# Patient Record
Sex: Female | Born: 1935 | Race: Black or African American | Hispanic: No | Marital: Single | Smoking: Never smoker
Health system: Southern US, Community
[De-identification: ages and names within clinical notes are randomized; demographics above are authoritative.]

## PROBLEM LIST (undated history)

## (undated) DIAGNOSIS — M1990 Unspecified osteoarthritis, unspecified site: Secondary | ICD-10-CM

## (undated) DIAGNOSIS — Z7722 Contact with and (suspected) exposure to environmental tobacco smoke (acute) (chronic): Secondary | ICD-10-CM

## (undated) HISTORY — DX: Contact with and (suspected) exposure to environmental tobacco smoke (acute) (chronic): Z77.22

## (undated) HISTORY — PX: HIP SURGERY: SHX245

## (undated) HISTORY — PX: TONSILLECTOMY: SUR1361

---

## 2013-05-01 HISTORY — PX: CATARACT EXTRACTION: SUR2

## 2013-05-28 HISTORY — PX: TOTAL HIP ARTHROPLASTY: SHX124

## 2013-11-15 HISTORY — PX: CATARACT EXTRACTION: SUR2

## 2016-04-06 MED ORDER — MEPOLIZUMAB 100 MG SUBCUTANEOUS SOLUTION
100 mg | Freq: Once | SUBCUTANEOUS | Status: AC
Start: 2016-04-06 — End: 2016-04-07
  Administered 2016-04-07: 18:00:00 via SUBCUTANEOUS

## 2016-04-06 MED FILL — NUCALA 100 MG SUBCUTANEOUS SOLUTION: 100 mg | SUBCUTANEOUS | Qty: 100

## 2016-04-07 ENCOUNTER — Inpatient Hospital Stay: Admit: 2016-04-07 | Payer: MEDICARE | Primary: Internal Medicine

## 2016-04-07 DIAGNOSIS — J4550 Severe persistent asthma, uncomplicated: Secondary | ICD-10-CM

## 2016-04-07 NOTE — Progress Notes (Signed)
[facility] Progress Note    Date: April 07, 2016    Name: Rachael Gonzalez    MRN: [id number]         DOB: 1932-10-25      12510 Pt arrived ambulatory and in no distress for Nucala injection.      Assessment complete. No new complaints voiced. 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  Patient unable to provide and good history.  PCP office called and was able to fax over a previous office note which provided history and a medication list.        Visit Vitals   ??? BP 134/72 (BP 1 Location: Right arm, BP Patient Position: Sitting)   ??? Pulse 81   ??? Temp 97.7 ??F (36.5 ??C)   ??? Resp 18   ??? Breastfeeding No       Medications received:  mepolizumab (NUCALA) injection 100 mg   Patient observed for 20 mins post injection.        1410 Discharged home ambulatory and in no distress, accompanied by friend.     Next appointment 05/05/16, patient aware.        Jaynie Collinsuanda L Enzo Treu, RN  April 07, 2016

## 2016-04-30 MED ORDER — MEPOLIZUMAB 100 MG SUBCUTANEOUS SOLUTION
100 mg | Freq: Once | SUBCUTANEOUS | Status: AC
Start: 2016-04-30 — End: 2016-05-05
  Administered 2016-05-05: 19:00:00 via SUBCUTANEOUS

## 2016-04-30 MED FILL — NUCALA 100 MG SUBCUTANEOUS SOLUTION: 100 mg | SUBCUTANEOUS | Qty: 100

## 2016-05-05 ENCOUNTER — Inpatient Hospital Stay: Admit: 2016-05-05 | Payer: MEDICARE | Primary: Internal Medicine

## 2016-05-05 NOTE — Progress Notes (Signed)
[facility] Progress Note    Date: May 05, 2016    Name: Rachael Gonzalez    MRN: [id number]         DOB: 1932/10/16      1325 Pt arrived ambulatory and in no distress for Nucala injection.      Assessment complete. No new complaints voiced.     Visit Vitals   ??? BP 148/56 (BP 1 Location: Right arm, BP Patient Position: Sitting)   ??? Pulse 75   ??? Temp 97.9 ??F (36.6 ??C)   ??? Resp 18   ??? SpO2 99%       Medications received:  mepolizumab (NUCALA) injection 100 mg, given in the right arm      1355 Discharged home ambulatory and in no distress, accompanied by self.     Next appointment 06/02/16, patient aware.        Jaynie Collinsuanda L Loistine Eberlin, RN  May 05, 2016

## 2016-05-05 NOTE — Progress Notes (Signed)
Problem: Knowledge Deficit  Goal: *Verbalizes understanding of procedures and medications  Outcome: Progressing Towards Goal  Patient denies any questio or concerns regarding medication

## 2016-06-01 MED ORDER — MEPOLIZUMAB 100 MG SUBCUTANEOUS SOLUTION
100 mg | Freq: Once | SUBCUTANEOUS | Status: AC
Start: 2016-06-01 — End: 2016-06-02
  Administered 2016-06-02: 19:00:00 via SUBCUTANEOUS

## 2016-06-01 MED FILL — NUCALA 100 MG SUBCUTANEOUS SOLUTION: 100 mg | SUBCUTANEOUS | Qty: 100

## 2016-06-02 ENCOUNTER — Inpatient Hospital Stay: Admit: 2016-06-02 | Payer: MEDICARE | Primary: Internal Medicine

## 2016-06-02 DIAGNOSIS — J4550 Severe persistent asthma, uncomplicated: Secondary | ICD-10-CM

## 2016-06-02 NOTE — Progress Notes (Signed)
[facility] Progress Note    Date: June 02, 2016    Name: Rachael Gonzalez    MRN: [id number]         DOB: 1932/06/18      1325 Pt arrived ambulatory and in no distress for Nucala injection.      Assessment complete. No new complaints voiced.     Visit Vitals   ??? BP 144/76   ??? Pulse 90   ??? Temp 98.3 ??F (36.8 ??C)   ??? Resp 18       Medications received:  mepolizumab (NUCALA) injection 100 mg, given in the left arm      1340 Discharged home ambulatory and in no distress, accompanied by self.     Next appointment 06/30/16, patient aware.        Jaynie Collinsuanda L Rodrecus Belsky, RN  June 02, 2016

## 2016-06-28 MED ORDER — MEPOLIZUMAB 100 MG SUBCUTANEOUS SOLUTION
100 mg | Freq: Once | SUBCUTANEOUS | Status: AC
Start: 2016-06-28 — End: 2016-06-30
  Administered 2016-06-30: 18:00:00 via SUBCUTANEOUS

## 2016-06-28 MED FILL — NUCALA 100 MG SUBCUTANEOUS SOLUTION: 100 mg | SUBCUTANEOUS | Qty: 100

## 2016-06-30 ENCOUNTER — Inpatient Hospital Stay: Admit: 2016-06-30 | Payer: MEDICARE | Primary: Internal Medicine

## 2016-06-30 DIAGNOSIS — J4550 Severe persistent asthma, uncomplicated: Secondary | ICD-10-CM

## 2016-06-30 NOTE — Progress Notes (Signed)
1320 Pt arrived ambulatory and in no distress for Nucala.  Assessment complete. Pt reports "this shot isn't working, I'm still coughing all the time." Pt reports she will discuss with physician on follow-up visit.   Patient Vitals for the past 12 hrs:   Temp Pulse Resp BP SpO2   06/30/16 1325 97.8 ??F (36.6 ??C) (!) 106 18 130/71 98 %     Medications:  Nucala 100 mg sq right arm    1355 Discharged home ambulatory and in no distress, accompanied by friend. Pt to follow-up with referring, reports she may not continue with injection.

## 2016-07-28 MED ORDER — MEPOLIZUMAB 100 MG SUBCUTANEOUS SOLUTION
100 mg | Freq: Once | SUBCUTANEOUS | Status: AC
Start: 2016-07-28 — End: 2016-07-29
  Administered 2016-07-29: 17:00:00 via SUBCUTANEOUS

## 2016-07-28 MED FILL — NUCALA 100 MG SUBCUTANEOUS SOLUTION: 100 mg | SUBCUTANEOUS | Qty: 100

## 2016-07-29 ENCOUNTER — Inpatient Hospital Stay: Admit: 2016-07-29 | Payer: MEDICARE | Primary: Internal Medicine

## 2016-07-29 DIAGNOSIS — J45909 Unspecified asthma, uncomplicated: Secondary | ICD-10-CM

## 2016-07-29 NOTE — Progress Notes (Signed)
[facility] Progress Note    Date: July 29, 2016    Name: Rachael Gonzalez    MRN: [id number]         DOB: 1932/11/04      1215 Pt arrived ambulatory and in no distress for Nucala injection.  Pt reports she "still thinks this isn't working, but I'll take two more like the doctor asked." Pt denies any worsening cough or secretions, denies fever; reports cough "just no better."    Assessment complete. No new complaints voiced.   Medications received:  mepolizumab (NUCALA) injection 100 mg, given in the rightarm      1255 Discharged home ambulatory and in no distress, accompanied by self.     Next appointment 08/25/16, patient aware.        Bryna Colander, RN  July 29, 2016

## 2016-08-24 MED ORDER — MEPOLIZUMAB 100 MG SUBCUTANEOUS SOLUTION
100 mg | Freq: Once | SUBCUTANEOUS | Status: AC
Start: 2016-08-24 — End: 2016-08-25
  Administered 2016-08-25: 17:00:00 via SUBCUTANEOUS

## 2016-08-24 MED FILL — NUCALA 100 MG SUBCUTANEOUS SOLUTION: 100 mg | SUBCUTANEOUS | Qty: 100

## 2016-08-25 ENCOUNTER — Inpatient Hospital Stay: Admit: 2016-08-25 | Payer: MEDICARE | Primary: Internal Medicine

## 2016-08-25 DIAGNOSIS — J45909 Unspecified asthma, uncomplicated: Secondary | ICD-10-CM

## 2016-08-25 NOTE — Progress Notes (Signed)
[facility] Progress Note    Date: Aug 25, 2016    Name: Rachael Gonzalez    MRN: [id number]         DOB: [birth year]      1230 Pt arrived ambulatory and in no distress for Nucala injection.  Pt reports " this isn't working." Reports she is willing to take this last injection and see provider on 09/17/16 regarding an alternative treatment. Pt denies any worsening cough or secretions, denies fever; reports cough "just no better."  Patient Vitals for the past 12 hrs:   Temp Pulse Resp BP SpO2   08/25/16 1233 98.5 ??F (36.9 ??C) 76 18 126/63 95 %       Assessment complete. No new complaints voiced.   Medications received:  mepolizumab (NUCALA) injection 100 mg sq right arm      1300 Discharged home ambulatory and in no distress, accompanied by self.     Next appointment 09/29/16, patient aware.  Pt reports she will contact [facility] after appt with provider regarding future injections.       Bryna ColanderPriscilla N Ambang, RN  Aug 25, 2016

## 2016-09-24 MED ORDER — MEPOLIZUMAB 100 MG SUBCUTANEOUS SOLUTION
100 mg | Freq: Once | SUBCUTANEOUS | Status: AC
Start: 2016-09-24 — End: 2016-09-29

## 2016-09-24 MED FILL — NUCALA 100 MG SUBCUTANEOUS SOLUTION: 100 mg | SUBCUTANEOUS | Qty: 100

## 2016-09-30 ENCOUNTER — Inpatient Hospital Stay: Payer: MEDICARE | Primary: Internal Medicine

## 2019-11-27 ENCOUNTER — Other Ambulatory Visit: Payer: Self-pay | Admitting: Family Medicine

## 2019-11-27 DIAGNOSIS — J411 Mucopurulent chronic bronchitis: Secondary | ICD-10-CM

## 2019-12-04 ENCOUNTER — Ambulatory Visit
Admission: RE | Admit: 2019-12-04 | Discharge: 2019-12-04 | Disposition: A | Discharge status: Home / Self Care | Payer: Medicare Other | Source: Ambulatory Visit | Attending: Family Medicine | Admitting: Family Medicine

## 2019-12-04 DIAGNOSIS — J411 Mucopurulent chronic bronchitis: Secondary | ICD-10-CM

## 2020-01-13 NOTE — Progress Notes (Signed)
Synopsis: Referred in Oct 2021 for cough by Margot Ables*  Subjective:   [name] GENDER: female DOB: 1932/11/27, MRN: [id number]  Chief Complaint  Patient presents with  . Consult    cough like "whooping cough" since August. abnormal Xray referred to office.    86 yo FM, PMH no signifigant history except hip surgery and second hand smoke exposure. C/o cough. She has had cough for about 10 years. Her cough occurs mainly at night time. She has episodic flares, thick white mucus. When she drinks water and tea and when she drinks stuff and she coughs. She occasionally gets choked on foods. 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  History is somewhat limited due to the patient's difficulty hearing.  However with talking slow and loudly she is able to understand well.  Her biggest issue is the ongoing trouble with coughing.  She has not had any blood.  Denies hemoptysis.  No recent sick contacts.  Weight has been stable.   Past Medical History:  Diagnosis Date  . Second hand smoke exposure      Family History  Problem Relation Age of Onset  . Breast cancer Mother   . Stroke Father      Past Surgical History:  Procedure Laterality Date  . HIP SURGERY      Social History   Socioeconomic History  . Marital status: Single    Spouse name: Not on file  . Number of children: Not on file  . Years of education: Not on file  . Highest education level: Not on file  Occupational History  . Not on file  Tobacco Use  . Smoking status: Passive Smoke Exposure - Never Smoker  . Smokeless tobacco: Never Used  Substance and Sexual Activity  . Alcohol use: Not on file  . Drug use: Not on file  . Sexual activity: Not on file  Other Topics Concern  . Not on file  Social History Narrative  . Not on file   Social Determinants of Health   Financial Resource Strain:   . Difficulty of Paying Living Expenses: Not on file   Food Insecurity:   . Worried About Programme researcher, broadcasting/film/video in the Last Year: Not on file  . Ran Out of Food in the Last Year: Not on file  Transportation Needs:   . Lack of Transportation (Medical): Not on file  . Lack of Transportation (Non-Medical): Not on file  Physical Activity:   . Days of Exercise per Week: Not on file  . Minutes of Exercise per Session: Not on file  Stress:   . Feeling of Stress : Not on file  Social Connections:   . Frequency of Communication with Friends and Family: Not on file  . Frequency of Social Gatherings with Friends and Family: Not on file  . Attends Religious Services: Not on file  . Active Member of Clubs or Organizations: Not on file  . Attends Banker Meetings: Not on file  . Marital Status: Not on file  Intimate Partner Violence:   . Fear of Current or Ex-Partner: Not on file  . Emotionally Abused: Not on file  . Physically Abused: Not on file  . Sexually Abused: Not on file     Not on File   No outpatient medications prior to visit.   No facility-administered medications prior to visit.    Review of Systems  Constitutional: Negative for chills, fever, malaise/fatigue  and weight loss.  HENT: Negative for hearing loss, sore throat and tinnitus.   Eyes: Negative for blurred vision and double vision.  Respiratory: Positive for cough, sputum production and shortness of breath. Negative for hemoptysis, wheezing and stridor.   Cardiovascular: Negative for chest pain, palpitations, orthopnea, leg swelling and PND.  Gastrointestinal: Negative for abdominal pain, constipation, diarrhea, heartburn, nausea and vomiting.  Genitourinary: Negative for dysuria, hematuria and urgency.  Musculoskeletal: Negative for joint pain and myalgias.  Skin: Negative for itching and rash.  Neurological: Negative for dizziness, tingling, weakness and headaches.  Endo/Heme/Allergies: Negative for environmental allergies. Does not bruise/bleed easily.   Psychiatric/Behavioral: Negative for depression. The patient is not nervous/anxious and does not have insomnia.   All other systems reviewed and are negative.    Objective:  Physical Exam Vitals reviewed.  Constitutional:      General: She is not in acute distress.    Appearance: She is well-developed.  HENT:     Head: Normocephalic and atraumatic.  Eyes:     General: No scleral icterus.    Conjunctiva/sclera: Conjunctivae normal.     Pupils: Pupils are equal, round, and reactive to light.  Neck:     Vascular: No JVD.     Trachea: No tracheal deviation.  Cardiovascular:     Rate and Rhythm: Normal rate and regular rhythm.     Heart sounds: Normal heart sounds. No murmur heard.   Pulmonary:     Effort: Pulmonary effort is normal. No tachypnea, accessory muscle usage or respiratory distress.     Breath sounds: Normal breath sounds. No stridor. No wheezing, rhonchi or rales.  Abdominal:     General: Bowel sounds are normal. There is no distension.     Palpations: Abdomen is soft.     Tenderness: There is no abdominal tenderness.  Musculoskeletal:        General: No tenderness.     Cervical back: Neck supple.  Lymphadenopathy:     Cervical: No cervical adenopathy.  Skin:    General: Skin is warm and dry.     Capillary Refill: Capillary refill takes less than 2 seconds.     Findings: No rash.  Neurological:     Mental Status: She is alert and oriented to person, place, and time.  Psychiatric:        Behavior: Behavior normal.      Vitals:   01/14/20 0927  BP: 118/60  Pulse: 78  Temp: (!) 97 F (36.1 C)  TempSrc: Temporal  SpO2: 97%  Weight: 166 lb (75.3 kg)  Height: 5' 2.75" (1.594 m)   97% on RA BMI Readings from Last 3 Encounters:  01/14/20 29.64 kg/m   Wt Readings from Last 3 Encounters:  01/14/20 166 lb (75.3 kg)     CBC No results found for: WBC, RBC, HGB, HCT, PLT, MCV, MCH, MCHC, RDW, LYMPHSABS, MONOABS, EOSABS, BASOSABS    Chest  Imaging: August 2021 CT chest: Bilateral lower lobe tubular bronchiectasis with scattered tree-in-bud. The patient's images have been independently reviewed by me.    Pulmonary Functions Testing Results: No flowsheet data found.  FeNO:   Pathology:   Echocardiogram:   Heart Catheterization:     Assessment & Plan:     ICD-10-CM   1. Bronchiectasis without complication (HCC)  J47.9   2. Increased sputum production  R09.3   3. Oropharyngeal dysphagia  R13.12 SLP modified barium swallow    DG Swallowing Func-Speech Pathology  4. Obstructive lung disease (generalized) (HCC)  J44.9   5. Chronic bronchitis, unspecified chronic bronchitis type (HCC)  J42    Discussion:  This is an 86 year old female, difficulty hearing, with evaluation today of cough for greater than 10 years.  She had a pulmonologist in [place] before moving here.  She has been seen by an allergist as well.  She currently takes no medications.  Her primary care doctor did put her on albuterol inhaler plus azithromycin recently.  In her history she does have difficulty swallowing and coughing with thin liquids.  Her CT scan of the chest has basilar predominant tubular bronchiectasis which would be consistent with chronic longstanding history of aspiration into the airway.  This recurrent airway injury has led to chronic bronchitis, bronchial thickening and increased sputum production.  Changes on the CT scan could represent MAI.  However would potentially expect to see more parenchymal involvement.  Plan: We will give her a albuterol nebulizer to help keep her airways open. We discussed the importance of airway clearance techniques.  Patient was counseled on these today in the office. New prescription for flutter valve. Also placed orders for M BSS and swallow function study by speech-language pathology. Orders also placed for sputum cultures.  To include AFB fungus and respiratory Gram stain. Patient to follow-up in our  clinic in approximately 6 weeks after swallow study has been completed.  No current outpatient medications on file.  I spent 62 minutes dedicated to the care of this patient on the date of this encounter to include pre-visit review of records, face-to-face time with the patient discussing conditions above, post visit ordering of testing, clinical documentation with the electronic health record, making appropriate referrals as documented, and communicating necessary findings to members of the patients care team.   Josephine Igo, DO  Pulmonary Critical Care 01/14/2020 9:51 AM

## 2020-01-13 NOTE — Patient Instructions (Addendum)
Thank you for visiting Dr. Tonia Brooms at [facility]. Today we recommend the following:  DME Supply order for albuterol solution, nebulizer machine and tubing/supplies  Flutter valve  Swallow study has been ordered.  Suptum respiratory cultures today  Return in about 6 weeks (around 02/25/2020) for with APP or Dr. Tonia Brooms.    Please do your part to reduce the spread of COVID-19.

## 2020-01-14 ENCOUNTER — Ambulatory Visit (INDEPENDENT_AMBULATORY_CARE_PROVIDER_SITE_OTHER): Payer: Medicare Other | Admitting: Pulmonary Disease

## 2020-01-14 ENCOUNTER — Encounter: Payer: Self-pay | Admitting: Pulmonary Disease

## 2020-01-14 ENCOUNTER — Other Ambulatory Visit: Payer: Self-pay

## 2020-01-14 VITALS — BP 118/60 | HR 78 | Temp 97.0°F | Ht 62.75 in | Wt 166.0 lb

## 2020-01-14 DIAGNOSIS — R1312 Dysphagia, oropharyngeal phase: Secondary | ICD-10-CM | POA: Diagnosis not present

## 2020-01-14 DIAGNOSIS — J479 Bronchiectasis, uncomplicated: Secondary | ICD-10-CM

## 2020-01-14 DIAGNOSIS — R093 Abnormal sputum: Secondary | ICD-10-CM

## 2020-01-14 DIAGNOSIS — J42 Unspecified chronic bronchitis: Secondary | ICD-10-CM

## 2020-01-14 DIAGNOSIS — J449 Chronic obstructive pulmonary disease, unspecified: Secondary | ICD-10-CM | POA: Diagnosis not present

## 2020-01-14 DIAGNOSIS — Z23 Encounter for immunization: Secondary | ICD-10-CM

## 2020-01-17 ENCOUNTER — Other Ambulatory Visit: Payer: Medicare Other

## 2020-01-17 ENCOUNTER — Telehealth: Payer: Self-pay | Admitting: Pulmonary Disease

## 2020-01-17 DIAGNOSIS — J449 Chronic obstructive pulmonary disease, unspecified: Secondary | ICD-10-CM

## 2020-01-17 DIAGNOSIS — R093 Abnormal sputum: Secondary | ICD-10-CM

## 2020-01-17 DIAGNOSIS — J479 Bronchiectasis, uncomplicated: Secondary | ICD-10-CM

## 2020-01-17 MED ORDER — ALBUTEROL SULFATE (2.5 MG/3ML) 0.083% IN NEBU: 2.5000 mg | INHALATION_SOLUTION | Freq: Four times a day (QID) | RESPIRATORY_TRACT | 5 refills | Status: DC | PRN

## 2020-01-17 NOTE — Telephone Encounter (Signed)
Spoke with the pt  She states needing albuterol neb sol to go to [employer] only sent neb machine- no med OR supplies  Now she only needs the supplies since med sent to [facility]  Will forward to [facility] to reach out to [employer] about her CPAP supplies

## 2020-01-21 ENCOUNTER — Other Ambulatory Visit (HOSPITAL_COMMUNITY): Payer: Self-pay

## 2020-01-21 DIAGNOSIS — R1310 Dysphagia, unspecified: Secondary | ICD-10-CM

## 2020-01-21 NOTE — Telephone Encounter (Signed)
High priority message sent to [employer] for an update.

## 2020-01-21 NOTE — Telephone Encounter (Signed)
Spoke with the pt  She received the neb supplies but has still not picked up neb solution  I explained to her on 01/17/20 that this was being sent to [facility]  She did not recall this and is aware med has been sent  Nothing further needed

## 2020-01-21 NOTE — Telephone Encounter (Signed)
Pt is calling back stating that she really needs her nebulizer solution - CB# [phone]

## 2020-01-30 ENCOUNTER — Other Ambulatory Visit: Payer: Self-pay

## 2020-01-30 ENCOUNTER — Ambulatory Visit (HOSPITAL_COMMUNITY)
Admission: RE | Admit: 2020-01-30 | Discharge: 2020-01-30 | Disposition: A | Discharge status: Home / Self Care | Payer: Medicare Other | Source: Ambulatory Visit | Attending: Pulmonary Disease | Admitting: Pulmonary Disease

## 2020-01-30 DIAGNOSIS — R1312 Dysphagia, oropharyngeal phase: Secondary | ICD-10-CM | POA: Insufficient documentation

## 2020-01-30 DIAGNOSIS — R1310 Dysphagia, unspecified: Secondary | ICD-10-CM | POA: Insufficient documentation

## 2020-01-30 NOTE — Progress Notes (Signed)
Speech Pathology - OP MBS  Clinical impressions:  Pt presents with a likely primary esophageal dysphagia with a normal oropharyngeal swallow with adequate mastication, brisk swallow response, and reliable laryngeal vestibule closure with no penetration/aspiration.  Brief scans of the esophagus revealed solid food/liquid barium retention and potential backflow.  Immediately after study, pt began unremitting coughing, regurgitation of barium/secretions, then began crying.  Support offered. Study was reviewed in basic terms, with recommendations to f/u with GI regarding esophageal function and likely dysmotility.  Handout with recs for dealing with esophageal deficits was provided and reviewed.  Pt verbalized understanding but she needs reinforcement.  She appeared anxious and was offered encouragement.  Pt would benefit from a GI consult, and hopefully ASAP given how uncomfortable she is.   Burech Mcfarland L. Samson Frederic, MA CCC/SLP Acute Rehabilitation Services Office number [phone] Pager [phone]

## 2020-01-31 ENCOUNTER — Other Ambulatory Visit: Payer: Self-pay

## 2020-01-31 DIAGNOSIS — R1319 Other dysphagia: Secondary | ICD-10-CM

## 2020-02-11 ENCOUNTER — Telehealth: Payer: Self-pay | Admitting: Pulmonary Disease

## 2020-02-11 MED ORDER — MUCINEX MAXIMUM STRENGTH 1200 MG PO TB12: 1.0000 | ORAL_TABLET | Freq: Two times a day (BID) | ORAL | 5 refills | Status: DC

## 2020-02-11 NOTE — Telephone Encounter (Signed)
Called and spoke with pt letting her know the info and recommendations stated by [facility] and she verbalized understanding. Pt said after the swallow study they did give her some paperwork about different risk precautions so she does not need to have anything mailed to her. Pt said that she would like to have  Rx for mucinex 1200mg  sent to pharmacy for her. This has been sent to preferred pharmacy for pt. Nothing further needed.

## 2020-02-11 NOTE — Telephone Encounter (Signed)
Plan: We will give her a albuterol nebulizer to help keep her airways open. We discussed the importance of airway clearance techniques.  Patient was counseled on these today in the office. New prescription for flutter valve. Also placed orders for M BSS and swallow function study by speech-language pathology. Orders also placed for sputum cultures.  To include AFB fungus and respiratory Gram stain. Patient to follow-up in our clinic in approximately 6 weeks after swallow study has been completed.     Called and spoke with pt who states she has been using the flutter valve and states she begins to cough so bad that she gets to where she is unable to eat due to coughing so much. Pt also stated after drinking some water or any other liquids, she will begin to cough a lot again. Pt said she has been coughing so much to the point that she almost throws up.  Pt also said that her coughing is interfering with her being able to sleep at night. Pt said when she drinks liquids, she will drink it at room temp not cold temp. Pt said she has been using the albuterol neb sol that was prescribed but says during a treatment, she will have to stop using it and then will have to spit out a lot of phlegm. Pt said that the phlegm she gets up is thick in consistency.  Asked pt if she has tried any OTC cough meds and she said that she did try Vicks cough med but that did not help. Pt said that she also has tried Robitussin but that did not help.  Asked pt if she was running a temp and she said that she did not have a thermometer to be able to check her temp but said that her forehead did feel warm but she did also just have a coughing fit.   Pt said that she just had swallow study performed and she said on the paperwork that was given to her after the study it said that it was recommended for her to follow up with GI. Pt wants to know what could be recommended to help with her symptoms.   Beth, please advise.

## 2020-02-11 NOTE — Telephone Encounter (Signed)
She should be using her nebulizer 2-3 times a day. That is a good thing she is getting mucus up, it will be thick in consistency that is to be expected with her condition. Recommend mucinex tablets 1,200mg  twice daily, we can send in prescription if needed  Barium swallow showed evidence of esophageal dysphagia. No aspiration. Recommending regular solids, thing liquids. Remain upright after eating. Regular oral care twice daily. We can mail her aspiration risk precautions

## 2020-02-25 ENCOUNTER — Other Ambulatory Visit: Payer: Self-pay

## 2020-02-25 ENCOUNTER — Ambulatory Visit (INDEPENDENT_AMBULATORY_CARE_PROVIDER_SITE_OTHER): Payer: Medicare Other | Admitting: Acute Care

## 2020-02-25 ENCOUNTER — Encounter: Payer: Self-pay | Admitting: Acute Care

## 2020-02-25 VITALS — BP 118/72 | HR 86 | Temp 97.7°F | Ht 62.0 in | Wt 160.6 lb

## 2020-02-25 DIAGNOSIS — R059 Cough, unspecified: Secondary | ICD-10-CM

## 2020-02-25 DIAGNOSIS — J479 Bronchiectasis, uncomplicated: Secondary | ICD-10-CM | POA: Diagnosis not present

## 2020-02-25 DIAGNOSIS — R1319 Other dysphagia: Secondary | ICD-10-CM | POA: Diagnosis not present

## 2020-02-25 NOTE — Patient Instructions (Addendum)
It is good to see you today. Sips of water instead of throat clearing Sugar Free Coca-Cola or Werther's originals for throat soothing. Delsym Cough syrup 5 cc's every 12 hours Non-sedating antihistamine of your choice daily ( Zyrtec, Allegra, Xyzol, Claritin ( Generic ok) We will refer you for GI consult for esophageal dysmotility.>> called and they will schedule patient today >> PPI, and GERD diet Consider sleeoing with your head of bed elevated .  Continue using your flutter valve 1-2 times daily, 3-4 puffs each time.  Continue Mucinex as you have been doing . We will have follow up after you see the GI doctor.  Follow the guidelines the speech therapist gave you regarding foods to avoid. Follow up in 1 month with Dr. Tonia Brooms or Maralyn Sago NP after GI evaluation Please contact office for sooner follow up if symptoms do not improve or worsen or seek emergency care

## 2020-02-25 NOTE — Progress Notes (Signed)
History of Present Illness Melinda Valencia is a 86 y.o. female with bronchiectasis, and HOH. She is followed by Dr. Tonia Brooms.   Synopsis 86 yo FM, PMH no signifigant history except hip surgery and second hand smoke exposure. C/o cough. She has had cough for about 10 years. Her cough occurs mainly at night time. She has episodic flares, thick white mucus. When she drinks water and tea and when she drinks liquids  and she coughs. She occasionally gets choked on foods. Swallow study was done and was negative for aspiration, but + for esophageal dysphagia. Marland Kitchen She was referred to GI for further work up.  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    02/25/2020  Pt. Presents for follow up stating she is coughing worse than her baseline. She states she has tried using the over the counter Mucinex, and she states her coughing is worse.  She does not seem to understand that she needs to cough up secretions up with her bronchiectasis to prevent infection. She just wants to stop the coughing. She is very frustrated, made more so by her hardness of hearing. We reviewed her swallow evaluation, and I explained that her Barium swallow was + for esophageal dysphagia/ dysmotility, and that this may be contributing to her cough. Dr. Tonia Brooms was concerned this was consistent with chronic longstanding history of aspiration into the airway.  This recurrent airway injury has led to chronic bronchitis, bronchial thickening and increased sputum production. She has been referred for GI consult, but has not been scheduled for appointment. I explained that her esophageal dysphagia may be contributing to her cough, and needs further work up. We discussed the foods she should be avoiding and reviewed the recommendations of her speech therapist. .  She states she is using her nebs daily, and that they also make her cough. Secretions are clear to white. She has no fever. She has had her Covid vaccine, and  her flu vaccine. When I asked about her Flutter valve, she stated that she had attached it to her neb machine. I pulled up a YouTube instruction video, and we watched it together. She said she would take the flutter valve off the neb machine and use it as we discussed after watching the video together. She prefers instruction written down as she has a very hard time hearing.   Test Results: Pt presents with a likely primary esophageal dysphagia with a normal oropharyngeal swallow with adequate mastication, brisk swallow response, and reliable laryngeal vestibule closure with no penetration/aspiration.  Brief scans of the esophagus revealed solid food/liquid barium retention and potential backflow.  Immediately after study, pt began unremitting coughing, regurgitation of barium/secretions, then began crying.  Support offered. Study was reviewed in basic terms, with recommendations to f/u with GI regarding esophageal function and likely dysmotility.  Handout with recs for dealing with esophageal deficits was provided and reviewed.  Pt verbalized understanding but she needs reinforcement.  She appeared anxious and was offered encouragement.  Pt would benefit from a GI consult, and hopefully ASAP given how uncomfortable she is.    SLP Visit Diagnosis Dysphagia, pharyngoesophageal phase (R13.14)    10/14 Sputum Cultures >> AFB Negative x 1 sample, negative for fungal, Normal Flora  CT Chest 12/04/2019>> Mild, tubular bronchiectasis throughout, most conspicuous in the right lower lobe. Innumerable scattered small centrilobular ground-glass pulmonary nodules throughout the lungs, nonspecific and infectious or inflammatory. No specific features to suggest a particular etiology, such as  atypical mycobacterium. Coronary artery disease.  Aortic Atherosclerosis   No flowsheet data found.  No flowsheet data found.  BNP No results found for: BNP  ProBNP No results found for: PROBNP  PFT No results  found for: FEV1PRE, FEV1POST, FVCPRE, FVCPOST, TLC, DLCOUNC, PREFEV1FVCRT, PSTFEV1FVCRT  DG SWALLOW FUNC OP MEDICARE SPEECH PATH  Result Date: 01/30/2020 Objective Swallowing Evaluation: Type of Study: MBS-Modified Barium Swallow Study  Patient Details Name: Melinda Valencia MRN: [id number] Date of Birth: [birth year] Today's Date: 01/30/2020 Time: SLP Start Time (ACUTE ONLY): 1155 -SLP Stop Time (ACUTE ONLY): 1225 SLP Time Calculation (min) (ACUTE ONLY): 30 min Past Medical History: Past Medical History: Diagnosis Date . Second hand smoke exposure  Past Surgical History: Past Surgical History: Procedure Laterality Date . HIP SURGERY   HPI:  86 year old female referred for OP MBS by Dr. Tonia BroomsIcard, pulmonology, due to chronic cough and difficulty swallowing, coughing with thin liquids.  Per MD, CT scan of the chest has basilar predominant tubular bronchiectasis which would be consistent with chronic longstanding history of aspiration into the airway.  Pt arrived with container to catch her secretion. She reported constant coughing, particularly at night, with production of secretions.   Subjective: communicative Assessment / Plan / Recommendation CHL IP CLINICAL IMPRESSIONS 01/30/2020 Clinical Impression Pt presents with a likely primary esophageal dysphagia with a normal oropharyngeal swallow with adequate mastication, brisk swallow response, and reliable laryngeal vestibule closure with no penetration/aspiration.  Brief scans of the esophagus revealed solid food/liquid barium retention and potential backflow.  Immediately after study, pt began unremitting coughing, regurgitation of barium/secretions, then began crying.  Support offered. Study was reviewed in basic terms, with recommendations to f/u with GI regarding esophageal function and likely dysmotility.  Handout with recs for dealing with esophageal deficits was provided and reviewed.  Pt verbalized understanding but she needs reinforcement.  She appeared anxious  and was offered encouragement.  Pt would benefit from a GI consult, and hopefully ASAP given how uncomfortable she is.  SLP Visit Diagnosis Dysphagia, pharyngoesophageal phase (R13.14) Attention and concentration deficit following -- Frontal lobe and executive function deficit following -- Impact on safety and function --   CHL IP TREATMENT RECOMMENDATION 01/30/2020 Treatment Recommendations No treatment recommended at this time   No flowsheet data found. CHL IP DIET RECOMMENDATION 01/30/2020 SLP Diet Recommendations Regular solids;Thin liquid Liquid Administration via Cup;Straw Medication Administration Whole meds with liquid Compensations Follow solids with liquid Postural Changes Remain semi-upright after after feeds/meals (Comment)   CHL IP OTHER RECOMMENDATIONS 01/30/2020 Recommended Consults Consider GI evaluation Oral Care Recommendations Oral care BID Other Recommendations --   CHL IP FOLLOW UP RECOMMENDATIONS 01/30/2020 Follow up Recommendations Other (comment)   No flowsheet data found.     CHL IP ORAL PHASE 01/30/2020 Oral Phase WFL Oral - Pudding Teaspoon -- Oral - Pudding Cup -- Oral - Honey Teaspoon -- Oral - Honey Cup -- Oral - Nectar Teaspoon -- Oral - Nectar Cup -- Oral - Nectar Straw -- Oral - Thin Teaspoon -- Oral - Thin Cup -- Oral - Thin Straw -- Oral - Puree -- Oral - Mech Soft -- Oral - Regular -- Oral - Multi-Consistency -- Oral - Pill -- Oral Phase - Comment --  CHL IP PHARYNGEAL PHASE 01/30/2020 Pharyngeal Phase WFL Pharyngeal- Pudding Teaspoon -- Pharyngeal -- Pharyngeal- Pudding Cup -- Pharyngeal -- Pharyngeal- Honey Teaspoon -- Pharyngeal -- Pharyngeal- Honey Cup -- Pharyngeal -- Pharyngeal- Nectar Teaspoon -- Pharyngeal -- Pharyngeal- Nectar Cup -- Pharyngeal -- Pharyngeal- Nectar Straw --  Pharyngeal -- Pharyngeal- Thin Teaspoon -- Pharyngeal -- Pharyngeal- Thin Cup -- Pharyngeal -- Pharyngeal- Thin Straw -- Pharyngeal -- Pharyngeal- Puree -- Pharyngeal -- Pharyngeal- Mechanical Soft --  Pharyngeal -- Pharyngeal- Regular -- Pharyngeal -- Pharyngeal- Multi-consistency -- Pharyngeal -- Pharyngeal- Pill -- Pharyngeal -- Pharyngeal Comment --  CHL IP CERVICAL ESOPHAGEAL PHASE 01/30/2020 Cervical Esophageal Phase Impaired Pudding Teaspoon -- Pudding Cup -- Honey Teaspoon -- Honey Cup -- Nectar Teaspoon -- Nectar Cup -- Nectar Straw -- Thin Teaspoon -- Thin Cup -- Thin Straw -- Puree -- Mechanical Soft -- Regular -- Multi-consistency -- Pill -- Cervical Esophageal Comment -- Blenda Mounts Laurice 01/30/2020, 1:19 PM            CLINICAL DATA:  Dysphagia. 1 minutes 17 seconds EXAM: MODIFIED BARIUM SWALLOW TECHNIQUE: Different consistencies of barium were administered orally to the patient by the Speech Pathologist. Imaging of the pharynx was performed in the lateral projection. The radiologist was present in the fluoroscopy room for this study, providing personal supervision. FLUOROSCOPY TIME:  Fluoroscopy Time: Radiation Exposure Index (if provided by the fluoroscopic device): Number of Acquired Spot Images: 0 COMPARISON:  None. FINDINGS: No aspiration with tested modalities. IMPRESSION: No aspiration with tested modalities. Please refer to the Speech Pathologists report for complete details and recommendations. Electronically Signed   By: Leanna Battles M.D.   On: 01/30/2020 12:22     Past medical hx Past Medical History:  Diagnosis Date  . Second hand smoke exposure      Social History   Tobacco Use  . Smoking status: Passive Smoke Exposure - Never Smoker  . Smokeless tobacco: Never Used  Substance Use Topics  . Alcohol use: Not on file  . Drug use: Not on file    Ms.Dorce reports that she is a non-smoker but has been exposed to tobacco smoke. She has never used smokeless tobacco.  Tobacco Cessation: Never smoker, but + passive smoke exposure  Past surgical hx, Family hx, Social hx all reviewed.  Current Outpatient Medications on File Prior to Visit  Medication Sig  .  albuterol (PROVENTIL) (2.5 MG/3ML) 0.083% nebulizer solution Take 3 mLs (2.5 mg total) by nebulization every 6 (six) hours as needed for wheezing or shortness of breath.  . fluticasone (FLONASE) 50 MCG/ACT nasal spray Place 1 spray into both nostrils daily.  . Guaifenesin (MUCINEX MAXIMUM STRENGTH) 1200 MG TB12 Take 1 tablet (1,200 mg total) by mouth in the morning and at bedtime.   No current facility-administered medications on file prior to visit.     Not on File  Review Of Systems:  Constitutional:   No  weight loss, night sweats,  Fevers, chills,+ fatigue, or  lassitude.  HEENT:   No headaches,  Difficulty swallowing,  Tooth/dental problems, or  Sore throat,                No sneezing, itching, ear ache, nasal congestion, post nasal drip  CV:  No chest pain,  Orthopnea, PND, swelling in lower extremities, anasarca, dizziness, palpitations, syncope.   GI  No heartburn, indigestion, abdominal pain, nausea, vomiting, diarrhea, change in bowel habits, loss of appetite, bloody stools.   Resp: No shortness of breath with exertion or at rest.  + excess mucus, ++ productive cough,  ++ non-productive cough,  No coughing up of blood.  No change in color of mucus.  No wheezing.  No chest wall deformity  Skin: no rash or lesions.  GU: no dysuria, change in color of urine, no  urgency or frequency.  No flank pain, no hematuria   MS:  No joint pain or swelling.  No decreased range of motion.  No back pain.  Psych:  No change in mood or affect. No depression or anxiety.  No memory loss.   Vital Signs BP 118/72 (BP Location: Left Arm, Cuff Size: Normal)   Pulse 86   Temp 97.7 F (36.5 C) (Oral)   Ht 5\' 2"  (1.575 m)   Wt 160 lb 9.6 oz (72.8 kg)   SpO2 100%   BMI 29.37 kg/m    Physical Exam:  General- No distress,  A&Ox3, appropriate ENT: No sinus tenderness, TM clear, pale nasal mucosa, no oral exudate,no post nasal drip, no LAN, Very HOH Cardiac: S1, S2, regular rate and rhythm, no  murmur Chest: No wheeze/ rales/ dullness; no accessory muscle use, no nasal flaring, no sternal retractions Abd.: Soft Non-tender, ND, BS + Ext: No clubbing cyanosis, edema Neuro:  Deconditioned at baseline, MAE x 4, A&O x 3, very HOH Skin: No rashes,No lesions,  warm and dry Psych: normal mood and behavior, frustrated   Assessment/Plan Cough Bronchiectasis Plan Sips of water instead of throat clearing Sugar Free or Werther's originals for throat soothing. Delsym Cough syrup 5 cc's every 12 hours Non-sedating antihistamine of your choice daily ( Zyrtec, Allegra, Xyzol, Claritin ( Generic ok) We will refer you for GI consult for esophageal dysmotility.  Continue using your flutter valve 1-2 times daily, 3-4 puffs each time.  We watched a video showing you how to use your flutter valve Continue Mucinex as you have been doing . We will have follow up after you see the GI doctor.  Follow up in 1 month with Dr. Coca-Cola or Tonia Brooms NP Please contact office for sooner follow up if symptoms do not improve or worsen or seek emergency care      Maralyn Sago, NP 02/25/2020  10:29 AM

## 2020-02-25 NOTE — Progress Notes (Signed)
PCCM: Thanks for seeing her  Melinda Igo, DO [facility] 02/25/2020 6:30 PM

## 2020-03-03 LAB — FUNGUS CULTURE W SMEAR
CULTURE:: NO GROWTH
MICRO NUMBER:: 11072143
SMEAR:: NONE SEEN
SPECIMEN QUALITY:: ADEQUATE

## 2020-03-03 LAB — RESPIRATORY CULTURE OR RESPIRATORY AND SPUTUM CULTURE
MICRO NUMBER:: 11072145
RESULT:: NORMAL
SPECIMEN QUALITY:: ADEQUATE

## 2020-03-03 LAB — MYCOBACTERIA,CULT W/FLUOROCHROME SMEAR
MICRO NUMBER:: 11072144
SMEAR:: NONE SEEN
SPECIMEN QUALITY:: ADEQUATE

## 2020-03-18 ENCOUNTER — Encounter: Payer: Self-pay | Admitting: Gastroenterology

## 2020-03-31 ENCOUNTER — Ambulatory Visit: Payer: Medicare Other | Admitting: Pulmonary Disease

## 2020-03-31 NOTE — Progress Notes (Deleted)
Synopsis: Referred in Oct 2021 for cough by Margot Ables*  Subjective:   [name] GENDER: female DOB: [birth year], MRN: [id number]  No chief complaint on file.   86 yo FM, PMH no signifigant history except hip surgery and second hand smoke exposure. C/o cough. She has had cough for about 10 years. Her cough occurs mainly at night time. She has episodic flares, thick white mucus. When she drinks water and tea and when she drinks stuff and she coughs. She occasionally gets choked on foods. 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  History is somewhat limited due to the patient's difficulty hearing.  However with talking slow and loudly she is able to understand well.  Her biggest issue is the ongoing trouble with coughing.  She has not had any blood.  Denies hemoptysis.  No recent sick contacts.  Weight has been stable.  OV 03/31/2020: Patient here today for follow-up regarding bronchiectasis.  Patient was last seen by Kandice Robinsons, NP in the office.  She had a swallow study after last office visit which showed esophageal dysphagia and dysmotility.  Recommended her to see a gastroenterologist.   Past Medical History:  Diagnosis Date  . Second hand smoke exposure      Family History  Problem Relation Age of Onset  . Breast cancer Mother   . Stroke Father      Past Surgical History:  Procedure Laterality Date  . HIP SURGERY      Social History   Socioeconomic History  . Marital status: Single    Spouse name: Not on file  . Number of children: Not on file  . Years of education: Not on file  . Highest education level: Not on file  Occupational History  . Not on file  Tobacco Use  . Smoking status: Passive Smoke Exposure - Never Smoker  . Smokeless tobacco: Never Used  Substance and Sexual Activity  . Alcohol use: Not on file  . Drug use: Not on file  . Sexual activity: Not on file  Other Topics Concern  . Not  on file  Social History Narrative  . Not on file   Social Determinants of Health   Financial Resource Strain: Not on file  Food Insecurity: Not on file  Transportation Needs: Not on file  Physical Activity: Not on file  Stress: Not on file  Social Connections: Not on file  Intimate Partner Violence: Not on file     Not on File   Outpatient Medications Prior to Visit  Medication Sig Dispense Refill  . albuterol (PROVENTIL) (2.5 MG/3ML) 0.083% nebulizer solution Take 3 mLs (2.5 mg total) by nebulization every 6 (six) hours as needed for wheezing or shortness of breath. 150 mL 5  . fluticasone (FLONASE) 50 MCG/ACT nasal spray Place 1 spray into both nostrils daily.    . Guaifenesin (MUCINEX MAXIMUM STRENGTH) 1200 MG TB12 Take 1 tablet (1,200 mg total) by mouth in the morning and at bedtime. 60 tablet 5   No facility-administered medications prior to visit.    ROS   Objective:  Physical Exam   There were no vitals filed for this visit.   on RA BMI Readings from Last 3 Encounters:  02/25/20 29.37 kg/m  01/14/20 29.64 kg/m   Wt Readings from Last 3 Encounters:  02/25/20 160 lb 9.6 oz (72.8 kg)  01/14/20 166 lb (75.3 kg)     CBC No results found for: WBC, RBC,  HGB, HCT, PLT, MCV, MCH, MCHC, RDW, LYMPHSABS, MONOABS, EOSABS, BASOSABS  MBSS/SLP:  Pt presents with a likely primary esophageal dysphagia with a normal oropharyngeal swallow with adequate mastication, brisk swallow response, and reliable laryngeal vestibule closure with no penetration/aspiration.  Brief scans of the esophagus revealed solid food/liquid barium retention and potential backflow.  Immediately after study, pt began unremitting coughing, regurgitation of barium/secretions, then began crying.  Support offered. Study was reviewed in basic terms, with recommendations to f/u with GI regarding esophageal function and likely dysmotility.  Handout with recs for dealing with esophageal deficits was provided and  reviewed.  Pt verbalized understanding but she needs reinforcement.  She appeared anxious and was offered encouragement.  Pt would benefit from a GI consult, and hopefully ASAP given how uncomfortable she is.      Chest Imaging: August 2021 CT chest: Bilateral lower lobe tubular bronchiectasis with scattered tree-in-bud. The patient's images have been independently reviewed by me.    Pulmonary Functions Testing Results: No flowsheet data found.  FeNO:   Pathology:   Echocardiogram:   Heart Catheterization:     Assessment & Plan:   No diagnosis found.  Discussion:  This is an 86 year old female, difficulty hearing, with evaluation today of cough for greater than 10 years.  She had a pulmonologist in [place] before moving here.  She has been seen by an allergist as well.  She currently takes no medications.  Her primary care doctor did put her on albuterol inhaler plus azithromycin recently.  In her history she does have difficulty swallowing and coughing with thin liquids.  Her CT scan of the chest has basilar predominant tubular bronchiectasis which would be consistent with chronic longstanding history of aspiration into the airway.  This recurrent airway injury has led to chronic bronchitis, bronchial thickening and increased sputum production.  Changes on the CT scan could represent MAI.  However would potentially expect to see more parenchymal involvement.  Plan: We will give her a albuterol nebulizer to help keep her airways open. We discussed the importance of airway clearance techniques.  Patient was counseled on these today in the office. New prescription for flutter valve. Also placed orders for M BSS and swallow function study by speech-language pathology. Orders also placed for sputum cultures.  To include AFB fungus and respiratory Gram stain. Patient to follow-up in our clinic in approximately 6 weeks after swallow study has been completed.   Current Outpatient  Medications:  .  albuterol (PROVENTIL) (2.5 MG/3ML) 0.083% nebulizer solution, Take 3 mLs (2.5 mg total) by nebulization every 6 (six) hours as needed for wheezing or shortness of breath., Disp: 150 mL, Rfl: 5 .  fluticasone (FLONASE) 50 MCG/ACT nasal spray, Place 1 spray into both nostrils daily., Disp: , Rfl:  .  Guaifenesin (MUCINEX MAXIMUM STRENGTH) 1200 MG TB12, Take 1 tablet (1,200 mg total) by mouth in the morning and at bedtime., Disp: 60 tablet, Rfl: 5  I spent *** minutes dedicated to the care of this patient on the date of this encounter to include pre-visit review of records, face-to-face time with the patient discussing conditions above, post visit ordering of testing, clinical documentation with the electronic health record, making appropriate referrals as documented, and communicating necessary findings to members of the patients care team.    Josephine Igo, DO [facility] 03/31/2020 8:25 AM

## 2020-04-09 ENCOUNTER — Ambulatory Visit (INDEPENDENT_AMBULATORY_CARE_PROVIDER_SITE_OTHER): Payer: Medicare Other | Admitting: Gastroenterology

## 2020-04-09 ENCOUNTER — Encounter: Payer: Self-pay | Admitting: Gastroenterology

## 2020-04-09 VITALS — BP 114/60 | HR 88 | Ht 62.0 in | Wt 155.2 lb

## 2020-04-09 DIAGNOSIS — R1310 Dysphagia, unspecified: Secondary | ICD-10-CM | POA: Diagnosis not present

## 2020-04-09 DIAGNOSIS — R059 Cough, unspecified: Secondary | ICD-10-CM

## 2020-04-09 MED ORDER — FAMOTIDINE 20 MG PO TABS: 20.0000 mg | ORAL_TABLET | Freq: Every day | ORAL | 2 refills | Status: DC

## 2020-04-09 MED ORDER — PANTOPRAZOLE SODIUM 40 MG PO TBEC: 40.0000 mg | DELAYED_RELEASE_TABLET | Freq: Two times a day (BID) | ORAL | 2 refills | Status: DC

## 2020-04-09 NOTE — Progress Notes (Signed)
04/09/2020 Melinda Valencia [id number] 1932-05-25   HISTORY OF PRESENT ILLNESS: This is an 87 year old female who is new to our office.  She is actually fairly new to the [place] area.  She is here today with complaints of chronic coughing.  She tells me that this has been going on for years and she has tried several nose sprays, inhalers, allergy medications, cough syrups.  She was actually maintained on a codeine-containing cough syrup for a long time, but now they will no longer prescribe that for her.  She describes accumulation of a lot of thick mucus in the back of her throat.  None of the other regimens that she has tried previously have helped.  She saw pulmonary here and they ordered a modified barium swallow study for complaints of dysphagia.  She denies any dysphagia to me and tells me that food does not get stuck and she does not really have any problems swallowing.  Nonetheless the modified barium swallow study showed likely esophageal dysphagia possibly related to esophageal dysmotility.  She says that she just coughs constantly all the time and has to bring out a lot of that thick mucus.  She is here alone today.  She is hard of hearing   Past Medical History:  Diagnosis Date  . Second hand smoke exposure    Past Surgical History:  Procedure Laterality Date  . CATARACT EXTRACTION Right 05/01/2013  . CATARACT EXTRACTION Left 11/15/2013  . TONSILLECTOMY     age 61  . TOTAL HIP ARTHROPLASTY Right 05/28/2013    reports that she is a non-smoker but has been exposed to tobacco smoke. She has never used smokeless tobacco. She reports previous alcohol use. She reports that she does not use drugs. family history includes Breast cancer in her mother; Diabetes in her father. No Known Allergies    Outpatient Encounter Medications as of 04/09/2020  Medication Sig  . albuterol (PROVENTIL) (2.5 MG/3ML) 0.083% nebulizer solution Take 3 mLs (2.5 mg total) by nebulization every 6 (six)  hours as needed for wheezing or shortness of breath.  Marland Kitchen albuterol (VENTOLIN HFA) 108 (90 Base) MCG/ACT inhaler Inhale 2 puffs into the lungs every 4 (four) hours as needed for wheezing or shortness of breath. (Patient not taking: Reported on 04/09/2020)  . fluticasone (FLONASE) 50 MCG/ACT nasal spray Place 1 spray into both nostrils daily. (Patient not taking: Reported on 04/09/2020)  . Guaifenesin (MUCINEX MAXIMUM STRENGTH) 1200 MG TB12 Take 1 tablet (1,200 mg total) by mouth in the morning and at bedtime. (Patient not taking: Reported on 04/09/2020)   No facility-administered encounter medications on file as of 04/09/2020.     REVIEW OF SYSTEMS  : All other systems reviewed and negative except where noted in the History of Present Illness.   PHYSICAL EXAM: BP 114/60 (BP Location: Left Arm, Patient Position: Sitting, Cuff Size: Normal)   Pulse 88   Ht 5\' 2"  (1.575 m) Comment: height measured without shoes  Wt 155 lb 4 oz (70.4 kg)   BMI 28.40 kg/m  General: Well developed AA female in no acute distress Head: Normocephalic and atraumatic Eyes:  Sclerae anicteric, conjunctiva pink. Ears: HOH. Lungs: Clear throughout to auscultation; no W/R/R. Heart: Regular rate and rhythm; no M/R/G. Abdomen: Soft, non-distended.  BS present.  Non-tender. Musculoskeletal: Symmetrical with no gross deformities  Skin: No lesions on visible extremities Extremities: No edema  Neurological: Alert oriented x 4, grossly non-focal Psychological:  Alert and cooperative. Normal mood and affect  ASSESSMENT AND PLAN: *Chronic coughing: Has been occurring for years, she describes accumulation of mucus in the back of her throat.  Has tried several inhalers, nasal sprays, cough syrups, allergy medicines, etc.  Modified barium swallow study was ordered by pulmonary for diagnosis of dysphagia and they suggested an esophageal source such as dysmotility.  She does not really have any complaints of dysphagia.  We will proceed  with a barium esophagram.  I am going to put her on maximum acid suppression with pantoprazole 40 mg twice daily and Pepcid 40 mg at bedtime for the next month or so to see if this helps from a GI standpoint.  Prescriptions sent to the pharmacy.   CC:  Margot Ables*

## 2020-04-09 NOTE — Patient Instructions (Addendum)
If you are age 85 or older, your body mass index should be between 23-30. Your Body mass index is 28.4 kg/m. If this is out of the aforementioned range listed, please consider follow up with your Primary Care Provider.  If you are age 69 or younger, your body mass index should be between 19-25. Your Body mass index is 28.4 kg/m. If this is out of the aformentioned range listed, please consider follow up with your Primary Care Provider.   We have sent the following medications to your pharmacy for you to pick up at your convenience: Pantoprazole and Pepcid  You have been scheduled for a Barium Esophogram at [facility] (1st floor of the hospital) on 04/16/20 at 10:30. Please arrive 15 minutes prior to your appointment for registration. Make certain not to have anything to eat or drink 3 hours prior to your test. If you need to reschedule for any reason, please contact radiology at [phone] to do so. __________________________________________________________________ A barium swallow is an examination that concentrates on views of the esophagus. This tends to be a double contrast exam (barium and two liquids which, when combined, create a gas to distend the wall of the oesophagus) or single contrast (non-ionic iodine based). The study is usually tailored to your symptoms so a good history is essential. Attention is paid during the study to the form, structure and configuration of the esophagus, looking for functional disorders (such as aspiration, dysphagia, achalasia, motility and reflux) EXAMINATION You may be asked to change into a gown, depending on the type of swallow being performed. A radiologist and radiographer will perform the procedure. The radiologist will advise you of the type of contrast selected for your procedure and direct you during the exam. You will be asked to stand, sit or lie in several different positions and to hold a small amount of fluid in your mouth before being  asked to swallow while the imaging is performed .In some instances you may be asked to swallow barium coated marshmallows to assess the motility of a solid food bolus. The exam can be recorded as a digital or video fluoroscopy procedure. POST PROCEDURE It will take 1-2 days for the barium to pass through your system. To facilitate this, it is important, unless otherwise directed, to increase your fluids for the next 24-48hrs and to resume your normal diet.  This test typically takes about 30 minutes to perform. __________________________________________________________________________________  Thank you for choosing me and [facility].  Doug Sou, PA-C

## 2020-04-09 NOTE — Progress Notes (Signed)
Assessment and plan noted ?

## 2020-04-16 ENCOUNTER — Other Ambulatory Visit: Payer: Self-pay

## 2020-04-16 ENCOUNTER — Ambulatory Visit (HOSPITAL_COMMUNITY)
Admission: RE | Admit: 2020-04-16 | Discharge: 2020-04-16 | Disposition: A | Discharge status: Home / Self Care | Payer: Medicare Other | Source: Ambulatory Visit | Attending: Gastroenterology | Admitting: Gastroenterology

## 2020-04-16 DIAGNOSIS — R1310 Dysphagia, unspecified: Secondary | ICD-10-CM | POA: Insufficient documentation

## 2020-04-16 DIAGNOSIS — R059 Cough, unspecified: Secondary | ICD-10-CM | POA: Diagnosis present

## 2020-06-20 ENCOUNTER — Telehealth: Payer: Self-pay | Admitting: Gastroenterology

## 2020-06-20 ENCOUNTER — Ambulatory Visit: Payer: Medicare Other | Admitting: Gastroenterology

## 2020-06-20 NOTE — Telephone Encounter (Signed)
Error

## 2020-07-09 ENCOUNTER — Encounter: Payer: Self-pay | Admitting: Gastroenterology

## 2020-07-09 ENCOUNTER — Ambulatory Visit (INDEPENDENT_AMBULATORY_CARE_PROVIDER_SITE_OTHER): Payer: Medicare Other | Admitting: Gastroenterology

## 2020-07-09 VITALS — BP 110/52 | HR 80 | Ht 62.0 in | Wt 150.4 lb

## 2020-07-09 DIAGNOSIS — R059 Cough, unspecified: Secondary | ICD-10-CM

## 2020-07-09 MED ORDER — FAMOTIDINE 20 MG PO TABS: 20.0000 mg | ORAL_TABLET | Freq: Every day | ORAL | 2 refills | Status: DC

## 2020-07-09 MED ORDER — DEXLANSOPRAZOLE 60 MG PO CPDR: 60.0000 mg | DELAYED_RELEASE_CAPSULE | Freq: Every day | ORAL | 3 refills | Status: DC

## 2020-07-09 MED ORDER — FLUTICASONE PROPIONATE 50 MCG/ACT NA SUSP: NASAL | 2 refills | Status: DC

## 2020-07-09 NOTE — Progress Notes (Signed)
Assessment reviewed.  It is unlikely that her longstanding chronic cough is reflux related given the lack of response to high-dose adequate acid suppressive therapy

## 2020-07-09 NOTE — Progress Notes (Signed)
07/09/2020 Melinda Valencia [id number] 09-Mar-1933   HISTORY OF PRESENT ILLNESS: This is an 87 year old female who was seen by me in consult back in January for complaints of chronic cough for years.  She has tried several inhalers, nasal sprays, cough syrups, allergy medicines, etc.  Modified barium swallow study suggested an esophageal source such as dysmotility.  Esophagram showed esophageal dysmotility with signs of esophagitis.  She had stasis in the proximal esophagus which moved to and fro and reflux into the larynx, which elicited cough.  No signs of aspiration were noted.  She had reflux into the mid esophagus when recumbent.  We have had her on pantoprazole 40 mg twice daily and Pepcid 20 mg at bedtime.  No improvement in her symptoms on that regimen.  She says that she continues to cough up a lot of thick mucus-like material.  She says she has not able to sleep at night.  She says that she had been given codeine cough syrup to help suppress her cough in the past that helped to a degree.   Past Medical History:  Diagnosis Date  . Second hand smoke exposure    Past Surgical History:  Procedure Laterality Date  . CATARACT EXTRACTION Right 05/01/2013  . CATARACT EXTRACTION Left 11/15/2013  . TONSILLECTOMY     age 27  . TOTAL HIP ARTHROPLASTY Right 05/28/2013    reports that she is a non-smoker but has been exposed to tobacco smoke. She has never used smokeless tobacco. She reports previous alcohol use. She reports that she does not use drugs. family history includes Breast cancer in her mother; Diabetes in her father. No Known Allergies    Outpatient Encounter Medications as of 07/09/2020  Medication Sig  . albuterol (PROVENTIL) (2.5 MG/3ML) 0.083% nebulizer solution Take 3 mLs (2.5 mg total) by nebulization every 6 (six) hours as needed for wheezing or shortness of breath.  Marland Kitchen albuterol (VENTOLIN HFA) 108 (90 Base) MCG/ACT inhaler Inhale 2 puffs into the lungs every 4 (four) hours  as needed for wheezing or shortness of breath.  . famotidine (PEPCID) 20 MG tablet Take 1 tablet (20 mg total) by mouth at bedtime.  . fluticasone (FLONASE) 50 MCG/ACT nasal spray Place 1 spray into both nostrils daily.  . Guaifenesin (MUCINEX MAXIMUM STRENGTH) 1200 MG TB12 Take 1 tablet (1,200 mg total) by mouth in the morning and at bedtime.  . pantoprazole (PROTONIX) 40 MG tablet Take 1 tablet (40 mg total) by mouth 2 (two) times daily.   No facility-administered encounter medications on file as of 07/09/2020.     REVIEW OF SYSTEMS  : All other systems reviewed and negative except where noted in the History of Present Illness.   PHYSICAL EXAM: BP (!) 110/52 (BP Location: Left Arm, Patient Position: Sitting, Cuff Size: Normal)   Pulse 80   Ht 5\' 2"  (1.575 m)   Wt 150 lb 6 oz (68.2 kg)   BMI 27.50 kg/m  General: Well developed AA female in no acute distress Head: Normocephalic and atraumatic Eyes:  Sclerae anicteric, conjunctiva pink. Ears: Normal auditory acuity Lungs: Clear throughout to auscultation; no W/R/R. Heart: Regular rate and rhythm; no M/R/G. Abdomen: Soft, non-distended.  BS present.  Non-tender. Musculoskeletal: Symmetrical with no gross deformities  Skin: No lesions on visible extremities Extremities: No edema  Neurological: Alert oriented x 4, grossly non-focal Psychological:  Alert and cooperative. Normal mood and affect  ASSESSMENT AND PLAN: *Chronic coughing that has been present for years.  Has  tried several inhalers, nasal sprays, cough syrups, allergy medicines, etc.  Modified barium swallow study suggested an esophageal source such as dysmotility.  Esophagram showed esophageal dysmotility with signs of esophagitis.  She had stasis in the proximal esophagus which moved to and fro and reflux into the larynx, which elicited cough.  No signs of aspiration were noted.  She had reflux into the mid esophagus when recumbent.  We have had her on pantoprazole 40 mg twice  daily and Pepcid 20 mg at bedtime.  No improvement in her symptoms on that regimen.  I am going to discontinue the pantoprazole and place her on Dexilant 60 mg daily in addition to the Pepcid at bedtime.  She would like me to renew her Flonase prescription as well.  I did oblige to do that.  Prescriptions sent to pharmacy.  She says that cough syrup with codeine helped suppress her cough in the past and she is asking for that.  I told her she needs to address it with her PCP and we did place a call into her PCPs office to see if they would be willing to prescribe that for her.  We left a message, but so far have not heard back from them.  We advised that she is welcome to reach out to them again in regards to that prescription as well.   CC:  Margot Ables*

## 2020-07-09 NOTE — Patient Instructions (Signed)
If you are age 85 or older, your body mass index should be between 23-30. Your Body mass index is 27.5 kg/m. If this is out of the aforementioned range listed, please consider follow up with your Primary Care Provider.  If you are age 79 or younger, your body mass index should be between 19-25. Your Body mass index is 27.5 kg/m. If this is out of the aformentioned range listed, please consider follow up with your Primary Care Provider.   Please start the Dexilant and discontinue the pantoprazole.  Thank you for trusting me with your gastrointestinal care!    Doug Sou PA-C

## 2020-08-06 ENCOUNTER — Ambulatory Visit (INDEPENDENT_AMBULATORY_CARE_PROVIDER_SITE_OTHER): Payer: Medicare Other | Admitting: Pulmonary Disease

## 2020-08-06 ENCOUNTER — Telehealth: Payer: Self-pay

## 2020-08-06 ENCOUNTER — Other Ambulatory Visit: Payer: Self-pay

## 2020-08-06 ENCOUNTER — Encounter: Payer: Self-pay | Admitting: Pulmonary Disease

## 2020-08-06 VITALS — BP 116/80 | HR 87 | Temp 98.3°F | Ht 62.0 in | Wt 152.0 lb

## 2020-08-06 DIAGNOSIS — J42 Unspecified chronic bronchitis: Secondary | ICD-10-CM

## 2020-08-06 DIAGNOSIS — R1319 Other dysphagia: Secondary | ICD-10-CM | POA: Diagnosis not present

## 2020-08-06 DIAGNOSIS — R093 Abnormal sputum: Secondary | ICD-10-CM

## 2020-08-06 DIAGNOSIS — J479 Bronchiectasis, uncomplicated: Secondary | ICD-10-CM | POA: Diagnosis not present

## 2020-08-06 DIAGNOSIS — R059 Cough, unspecified: Secondary | ICD-10-CM | POA: Diagnosis not present

## 2020-08-06 NOTE — Telephone Encounter (Signed)
-----  Message from Loralie Champagne, PA-C sent at 08/06/2020  1:26 PM EDT ----- Please let the patient know that the pulmonologist reached out to [place] again about her coughing and we have decided that she should have an esophageal manometry and pH/impedance study while ON her PPI therapy.  Please get that scheduled if she is agreeable.  Thank you,  Janett Billow  ----- Message ----- From: Lavena Bullion, DO Sent: 08/06/2020   1:17 PM EDT To: Laban Emperor Zehr, PA-C  I think if you just get her EM/pH/Mii ordered, she can f/u after to review results. Not sure another appt now without new data or treatment option makes sense for her or your time.   ----- Message ----- From: Loralie Champagne, PA-C Sent: 08/06/2020  12:23 PM EDT To: Lavena Bullion, DO  Yes, I remember her.  Does she need to be scheduled to see me back again then?  Or was she supposed to call us to make an appt?  ----- Message ----- From: Lavena Bullion, DO Sent: 08/06/2020  11:10 AM EDT To: Loralie Champagne, PA-C  Jessica, I received a call from the Pulmonologist about this patient.  She has been seen by you (and staffed with Dr. Henrene Pastor) as recent as 07/2020.  Sounds like she has continued symptoms which are quite bothersome, manifesting as cough with or shortly after meals, but also regurgitation throughout the day and night.  I saw the prior MBS which was negative for oropharyngeal dysphagia.  Esophagram that you ordered does demonstrate dysmotility along with refluxate.  The to and fro motion is an interesting finding, and wonder about superimposed Rumination.  Based on the work-up so far, I would recommend the following:  - Esophageal Manometry and pH/impedance testing.  The difficult part is whether or not to do the study on versus off acid suppression therapy.  I would think she clearly has reflux based on the esophagram, and would be curious to the degree that her acid is suppressed but continues to have nonacid refluxate, and  therefore could be a good case for doing this study ON therapy.  The fallback, of course, is that if that study is normal, one could argue her reflux is well controlled (although I doubt it).  Alternatively, doing the test OFF therapy, and if positive, would be a much more meaningful result.  I usually can have a good sense of which way to order it when I talk to the patient's, but I have never met her before. - Depending on the results, she may benefit from EGD to evaluate for significant LES laxity which would cause ongoing regurgitation despite high-dose acid suppression therapy.  Not that she would be a surgical candidate per se, but at least with give some answers - Can follow-up in the GI clinic with you and you can staff it with me or Dr. Henrene Pastor

## 2020-08-06 NOTE — Patient Instructions (Addendum)
Thank you for visiting Dr. Tonia Brooms at [facility]. Today we recommend the following:  F/u with Dr. Barron Alvine after Esophageal manometry  Return in about 6 months (around 02/06/2021). or as needed    Please do your part to reduce the spread of COVID-19.

## 2020-08-06 NOTE — Progress Notes (Signed)
Synopsis: Referred in Oct 2021 for cough by Margot Ables*  Subjective:   [name] GENDER: female DOB: 1932-09-30, MRN: [id number]  Chief Complaint  Patient presents with  . Follow-up    Pt states that her cough has gotten worse. States that she coughs while eating and also states that she does not sleep well at night due to her cough waking her up at night. States after using her nebulizer she will cough up thick phlegm.    87 yo FM, PMH no signifigant history except hip surgery and second hand smoke exposure. C/o cough. She has had cough for about 10 years. Her cough occurs mainly at night time. She has episodic flares, thick white mucus. When she drinks water and tea and when she drinks stuff and she coughs. She occasionally gets choked on foods. 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  History is somewhat limited due to the patient's difficulty hearing.  However with talking slow and loudly she is able to understand well.  Her biggest issue is the ongoing trouble with coughing.  She has not had any blood.  Denies hemoptysis.  No recent sick contacts.  Weight has been stable.  OV 08/06/2020: Patient here today for ongoing complaints of cough.  She has difficulty coughing still with eating and drinking.  Liquids seem to cause persistent problem.  I reviewed her recent GI consultation.  She has been on acid suppression with little improvement.  Also reviewed esophagram.  Has a to and fro motion of the fluid collection within the proximal esophagus with swallowing.  Also has esophageal dysmotility on MBS S.     Past Medical History:  Diagnosis Date  . Second hand smoke exposure      Family History  Problem Relation Age of Onset  . Breast cancer Mother   . Diabetes Father      Past Surgical History:  Procedure Laterality Date  . CATARACT EXTRACTION Right 05/01/2013  . CATARACT EXTRACTION Left 11/15/2013  .  TONSILLECTOMY     age 17  . TOTAL HIP ARTHROPLASTY Right 05/28/2013    Social History   Socioeconomic History  . Marital status: Single    Spouse name: Not on file  . Number of children: 0  . Years of education: Not on file  . Highest education level: Not on file  Occupational History  . Occupation: retired  Tobacco Use  . Smoking status: Passive Smoke Exposure - Never Smoker  . Smokeless tobacco: Never Used  Vaping Use  . Vaping Use: Never used  Substance and Sexual Activity  . Alcohol use: Not Currently    Comment: social 10-15 years ago 04/09/2020  . Drug use: Never  . Sexual activity: Not on file  Other Topics Concern  . Not on file  Social History Narrative  . Not on file   Social Determinants of Health   Financial Resource Strain: Not on file  Food Insecurity: Not on file  Transportation Needs: Not on file  Physical Activity: Not on file  Stress: Not on file  Social Connections: Not on file  Intimate Partner Violence: Not on file     No Known Allergies   Outpatient Medications Prior to Visit  Medication Sig Dispense Refill  . albuterol (PROVENTIL) (2.5 MG/3ML) 0.083% nebulizer solution Take 3 mLs (2.5 mg total) by nebulization every 6 (six) hours as needed for wheezing or shortness of breath. 150 mL 5  . albuterol (VENTOLIN  HFA) 108 (90 Base) MCG/ACT inhaler Inhale 2 puffs into the lungs every 4 (four) hours as needed for wheezing or shortness of breath.    . dexlansoprazole (DEXILANT) 60 MG capsule Take 1 capsule (60 mg total) by mouth daily. 90 capsule 3  . famotidine (PEPCID) 20 MG tablet Take 1 tablet (20 mg total) by mouth at bedtime. 30 tablet 2  . fluticasone (FLONASE) 50 MCG/ACT nasal spray SPRAY INTO NOSE DAILY 0.38 mL 2  . Guaifenesin (MUCINEX MAXIMUM STRENGTH) 1200 MG TB12 Take 1 tablet (1,200 mg total) by mouth in the morning and at bedtime. 60 tablet 5  . pantoprazole (PROTONIX) 40 MG tablet Take 1 tablet (40 mg total) by mouth 2 (two) times daily.  90 tablet 2  . fluticasone (FLONASE) 50 MCG/ACT nasal spray Place 1 spray into both nostrils daily.     No facility-administered medications prior to visit.    Review of Systems  Constitutional: Negative for chills, fever, malaise/fatigue and weight loss.  HENT: Negative for hearing loss, sore throat and tinnitus.   Eyes: Negative for blurred vision and double vision.  Respiratory: Positive for cough and shortness of breath. Negative for hemoptysis, sputum production, wheezing and stridor.   Cardiovascular: Negative for chest pain, palpitations, orthopnea, leg swelling and PND.  Gastrointestinal: Negative for abdominal pain, constipation, diarrhea, heartburn, nausea and vomiting.  Genitourinary: Negative for dysuria, hematuria and urgency.  Musculoskeletal: Negative for joint pain and myalgias.  Skin: Negative for itching and rash.  Neurological: Negative for dizziness, tingling, weakness and headaches.  Endo/Heme/Allergies: Negative for environmental allergies. Does not bruise/bleed easily.  Psychiatric/Behavioral: Negative for depression. The patient is not nervous/anxious and does not have insomnia.   All other systems reviewed and are negative.    Objective:  Physical Exam Vitals reviewed.  Constitutional:      General: She is not in acute distress.    Appearance: She is well-developed.  HENT:     Head: Normocephalic and atraumatic.  Eyes:     General: No scleral icterus.    Conjunctiva/sclera: Conjunctivae normal.     Pupils: Pupils are equal, round, and reactive to light.  Neck:     Vascular: No JVD.     Trachea: No tracheal deviation.  Cardiovascular:     Rate and Rhythm: Normal rate and regular rhythm.     Heart sounds: Normal heart sounds. No murmur heard.   Pulmonary:     Effort: Pulmonary effort is normal. No tachypnea, accessory muscle usage or respiratory distress.     Breath sounds: No stridor. No wheezing, rhonchi or rales.  Abdominal:     General: Bowel  sounds are normal. There is no distension.     Palpations: Abdomen is soft.     Tenderness: There is no abdominal tenderness.  Musculoskeletal:        General: No tenderness.     Cervical back: Neck supple.  Lymphadenopathy:     Cervical: No cervical adenopathy.  Skin:    General: Skin is warm and dry.     Capillary Refill: Capillary refill takes less than 2 seconds.     Findings: No rash.  Neurological:     Mental Status: She is alert and oriented to person, place, and time.  Psychiatric:        Behavior: Behavior normal.      Vitals:   08/06/20 1021  BP: 116/80  Pulse: 87  Temp: 98.3 F (36.8 C)  TempSrc: Temporal  SpO2: 98%  Weight: 152 lb (  68.9 kg)  Height: 5\' 2"  (1.575 m)   98% on RA BMI Readings from Last 3 Encounters:  08/06/20 27.80 kg/m  07/09/20 27.50 kg/m  04/09/20 28.40 kg/m   Wt Readings from Last 3 Encounters:  08/06/20 152 lb (68.9 kg)  07/09/20 150 lb 6 oz (68.2 kg)  04/09/20 155 lb 4 oz (70.4 kg)     CBC No results found for: WBC, RBC, HGB, HCT, PLT, MCV, MCH, MCHC, RDW, LYMPHSABS, MONOABS, EOSABS, BASOSABS    Chest Imaging: August 2021 CT chest: Bilateral lower lobe tubular bronchiectasis with scattered tree-in-bud. The patient's images have been independently reviewed by me.    Pulmonary Functions Testing Results: No flowsheet data found.  FeNO:   Pathology:   Echocardiogram:   Heart Catheterization:     Assessment & Plan:     ICD-10-CM   1. Bronchiectasis without complication (HCC)  J47.9   2. Cough  R05.9   3. Esophageal dysphagia  R13.19   4. Increased sputum production  R09.3   5. Chronic bronchitis, unspecified chronic bronchitis type (HCC)  J42     Discussion:  This is an 87 year old female, difficulty hearing, ongoing issues regarding cough.  Her work-up has included empiric treatments with bronchodilators with minimal response.  Also been seen by an allergist.  She has had an M BSS swallow study as well as a  barium esophagram.  Upon my interpretation of these results I still believe her cough is likely coming from her esophageal dysfunction.  I have reviewed these images with one of my colleagues Dr. 83.  We will plan for her to have a esophageal manometry.  And question the diagnosis of rumination syndrome regarding liquids.  Plan: Esophageal manometry ordered by GI. Plan for outpatient GI follow-up. Patient can see Barron Alvine again as needed in the future. As for her bronchiectasis she needs to focus on airway clearance techniques including I-S and flutter valve. I think however once we help prevent microaspiration of liquids into the airways her cough will improve.    Current Outpatient Medications:  .  albuterol (PROVENTIL) (2.5 MG/3ML) 0.083% nebulizer solution, Take 3 mLs (2.5 mg total) by nebulization every 6 (six) hours as needed for wheezing or shortness of breath., Disp: 150 mL, Rfl: 5 .  albuterol (VENTOLIN HFA) 108 (90 Base) MCG/ACT inhaler, Inhale 2 puffs into the lungs every 4 (four) hours as needed for wheezing or shortness of breath., Disp: , Rfl:  .  dexlansoprazole (DEXILANT) 60 MG capsule, Take 1 capsule (60 mg total) by mouth daily., Disp: 90 capsule, Rfl: 3 .  famotidine (PEPCID) 20 MG tablet, Take 1 tablet (20 mg total) by mouth at bedtime., Disp: 30 tablet, Rfl: 2 .  fluticasone (FLONASE) 50 MCG/ACT nasal spray, SPRAY INTO NOSE DAILY, Disp: 0.38 mL, Rfl: 2 .  Guaifenesin (MUCINEX MAXIMUM STRENGTH) 1200 MG TB12, Take 1 tablet (1,200 mg total) by mouth in the morning and at bedtime., Disp: 60 tablet, Rfl: 5 .  pantoprazole (PROTONIX) 40 MG tablet, Take 1 tablet (40 mg total) by mouth 2 (two) times daily., Disp: 90 tablet, Rfl: 2  I spent 41 minutes dedicated to the care of this patient on the date of this encounter to include pre-visit review of records, face-to-face time with the patient discussing conditions above, post visit ordering of testing, clinical documentation with the  electronic health record, making appropriate referrals as documented, and communicating necessary findings to members of the patients care team.    [place], DO  [facility] 08/06/2020 10:45 AM

## 2020-08-08 ENCOUNTER — Other Ambulatory Visit: Payer: Self-pay

## 2020-08-08 DIAGNOSIS — R1310 Dysphagia, unspecified: Secondary | ICD-10-CM

## 2020-08-08 DIAGNOSIS — R059 Cough, unspecified: Secondary | ICD-10-CM

## 2020-08-08 NOTE — Telephone Encounter (Signed)
The pt has been scheduled for mano, pH, impedance on 5/23 at [facility] at 12:30 on PPI.    Left message on machine to call back

## 2020-08-11 NOTE — Telephone Encounter (Signed)
The pt has been advised and instructed. All information has also been mailed.  She has been advised to call with any questions after receiving.  She also has been told not to stop her PPI.  The pt has been advised of the information and verbalized understanding.

## 2020-08-21 ENCOUNTER — Other Ambulatory Visit (HOSPITAL_COMMUNITY): Payer: Medicare Other

## 2020-08-25 ENCOUNTER — Telehealth: Payer: Self-pay | Admitting: Gastroenterology

## 2020-08-25 NOTE — Progress Notes (Signed)
Patient was a no call/no show for her Esophageal manometry and pH on Monday May 23rd.  Multiple attempts to reach patient with no answer.  Patient did contact [facility] past scheduled appointment time of 1230.  Patient has been rescheduled for June 13 at 1230.

## 2020-08-25 NOTE — Telephone Encounter (Signed)
Spoke patient in regards to updated appt information. Her esophageal manometry and 24 hr pH and impedance study has been rescheduled to Monday, 09/15/20 at 12:30 PM. Patient is aware that I will put updated instructions in the mail for her. Pt confirmed address on file. Patient is aware that pre-screening COVID test is no longer required. Patient verbalized understanding of all information and had no concerns at the end of the call.

## 2020-08-25 NOTE — Telephone Encounter (Signed)
Pt missed her manometry that was scheduled for today at [facility] due to transportation issues. Pls call her to r/s.

## 2020-09-15 ENCOUNTER — Telehealth: Payer: Self-pay | Admitting: General Surgery

## 2020-09-15 ENCOUNTER — Encounter (HOSPITAL_COMMUNITY)
Admission: RE | Disposition: A | Discharge status: Home / Self Care | Payer: Self-pay | Source: Home / Self Care | Attending: Gastroenterology

## 2020-09-15 ENCOUNTER — Ambulatory Visit (HOSPITAL_COMMUNITY)
Admission: RE | Admit: 2020-09-15 | Discharge: 2020-09-15 | Disposition: A | Discharge status: Home / Self Care | Payer: Medicare Other | Attending: Gastroenterology | Admitting: Gastroenterology

## 2020-09-15 DIAGNOSIS — R059 Cough, unspecified: Secondary | ICD-10-CM | POA: Diagnosis not present

## 2020-09-15 DIAGNOSIS — R1310 Dysphagia, unspecified: Secondary | ICD-10-CM | POA: Diagnosis not present

## 2020-09-15 HISTORY — PX: ESOPHAGEAL MANOMETRY: SHX5429

## 2020-09-15 HISTORY — PX: PH IMPEDANCE STUDY: SHX5565

## 2020-09-15 SURGERY — MANOMETRY, ESOPHAGUS
Anesthesia: Choice

## 2020-09-15 MED ORDER — LIDOCAINE VISCOUS HCL 2 % MT SOLN
OROMUCOSAL | Status: AC
  Filled 2020-09-15: qty 15

## 2020-09-15 SURGICAL SUPPLY — 2 items
FACESHIELD LNG OPTICON STERILE (SAFETY) IMPLANT
GLOVE BIO SURGEON STRL SZ8 (GLOVE) ×6 IMPLANT

## 2020-09-15 NOTE — Progress Notes (Signed)
Esophageal manometry performed per protocol without complications.  Patient tolerated well.   Patient unable to go forward with pH probe 24 hour study.  Patient states unable to return tomorrow to have probe removed due to transportation issues.  Patient states unable to get transportation for more than one day a week.    Sent email to Dr. Lavon Paganini and secure message to Dr. Barron Alvine regarding above to have patient coordinate alternatives with patient.  Patient verbalized understanding.

## 2020-09-15 NOTE — Telephone Encounter (Signed)
Patient was unable to complete the ph probe due to issue with Transportation. The patient would have to return to the hospital to have the ph probe removed tomorrow and was unable to do so. She needs visits only 1 day a week and to give her transportation atleast 5 days in advance. Patient was contacted and I explained after we receive the results from the manometry we will call her to advise if she needs to have the ph or a Bravo per Dr Barron Alvine

## 2020-09-17 ENCOUNTER — Encounter (HOSPITAL_COMMUNITY): Payer: Self-pay | Admitting: Gastroenterology

## 2020-10-13 ENCOUNTER — Other Ambulatory Visit: Payer: Self-pay

## 2020-10-13 DIAGNOSIS — R059 Cough, unspecified: Secondary | ICD-10-CM

## 2020-10-13 DIAGNOSIS — R1310 Dysphagia, unspecified: Secondary | ICD-10-CM

## 2020-11-03 ENCOUNTER — Ambulatory Visit (HOSPITAL_COMMUNITY)
Admission: RE | Admit: 2020-11-03 | Discharge: 2020-11-03 | Disposition: A | Discharge status: Home / Self Care | Payer: Medicare Other | Attending: Gastroenterology | Admitting: Gastroenterology

## 2020-11-03 ENCOUNTER — Encounter (HOSPITAL_COMMUNITY)
Admission: RE | Disposition: A | Discharge status: Home / Self Care | Payer: Self-pay | Source: Home / Self Care | Attending: Gastroenterology

## 2020-11-03 DIAGNOSIS — R1310 Dysphagia, unspecified: Secondary | ICD-10-CM | POA: Insufficient documentation

## 2020-11-03 DIAGNOSIS — R059 Cough, unspecified: Secondary | ICD-10-CM | POA: Diagnosis not present

## 2020-11-03 DIAGNOSIS — Z538 Procedure and treatment not carried out for other reasons: Secondary | ICD-10-CM | POA: Insufficient documentation

## 2020-11-03 SURGERY — CANCELLED PROCEDURE

## 2020-11-03 NOTE — Progress Notes (Signed)
Patient presented today for 24 hour pH study.  Patient was unable to return to hospital the following day to have probe removed.  The office was contacted and is going to reschedule patient to have Bravo placed with EGD.  They will also make sure to set up bringing Bravo back to hospital after 48 hours.  Case was cancelled and patient will await contact from the office.

## 2020-12-29 ENCOUNTER — Other Ambulatory Visit: Payer: Self-pay | Admitting: Family Medicine

## 2020-12-29 DIAGNOSIS — R053 Chronic cough: Secondary | ICD-10-CM

## 2020-12-29 DIAGNOSIS — R911 Solitary pulmonary nodule: Secondary | ICD-10-CM

## 2020-12-29 DIAGNOSIS — R634 Abnormal weight loss: Secondary | ICD-10-CM

## 2020-12-29 DIAGNOSIS — R1310 Dysphagia, unspecified: Secondary | ICD-10-CM

## 2020-12-30 ENCOUNTER — Telehealth: Payer: Self-pay | Admitting: Gastroenterology

## 2020-12-30 NOTE — Telephone Encounter (Signed)
pH/Impedance testing would be the study of choice to evaluate her sxs fully. Given the transportation issue, we previously thought it could be scheduled for placement at the end of the week, then removal beginning of the following week per those rules of transportation once/week only. Sounds like that is not the case though? Next best option would be EGD with Bravo placement in the [facility]. Would still need to schedule follow-up transportation to return the recorder, but wouldn't be as time sensitive. This would not be as sensitive a study, particularly with the loss of impedance data, but would possibly r/o uncontrolled reflux as source of chronic cough. Also increased risks as this is a procedure and requires sedation in this 87 year old.   Alternatively, she can reschedule appt in the GI clinic to go through all these options in detail.

## 2020-12-30 NOTE — Telephone Encounter (Signed)
Spoke with patient in regards to information. Patient has been scheduled for a follow up with Doug Sou, PA-C on Tuesday, 01/13/21 at 1:30 pm. Pt had no concerns at the end of the call.

## 2020-12-30 NOTE — Telephone Encounter (Signed)
Spoke with patient, she states that she was told that she was going to receive something in the mail that she could set up herself and wear for 5 days to monitor her cough. Pt states that she is not able to complete 24 pH and impedance at hospital because her transportation will not allow. Advised patient that she can make 2 requests (one for the day of probe placement and one the day the probe needs to be removed.), pt states that she will not be able to do this. I see a note from [facility] endo nurse but it was not sent to anyone from what I can see. Please advise, thanks.

## 2021-01-12 ENCOUNTER — Other Ambulatory Visit (HOSPITAL_COMMUNITY): Payer: Self-pay | Admitting: Family Medicine

## 2021-01-12 DIAGNOSIS — I700 Atherosclerosis of aorta: Secondary | ICD-10-CM

## 2021-01-12 DIAGNOSIS — R011 Cardiac murmur, unspecified: Secondary | ICD-10-CM

## 2021-01-12 DIAGNOSIS — R634 Abnormal weight loss: Secondary | ICD-10-CM

## 2021-01-13 ENCOUNTER — Ambulatory Visit (INDEPENDENT_AMBULATORY_CARE_PROVIDER_SITE_OTHER): Payer: Medicare Other | Admitting: Gastroenterology

## 2021-01-13 ENCOUNTER — Encounter: Payer: Self-pay | Admitting: Gastroenterology

## 2021-01-13 VITALS — BP 110/70 | HR 80 | Ht 62.0 in | Wt 139.0 lb

## 2021-01-13 DIAGNOSIS — R053 Chronic cough: Secondary | ICD-10-CM

## 2021-01-13 DIAGNOSIS — K219 Gastro-esophageal reflux disease without esophagitis: Secondary | ICD-10-CM | POA: Diagnosis not present

## 2021-01-13 MED ORDER — DEXLANSOPRAZOLE 60 MG PO CPDR: 60.0000 mg | DELAYED_RELEASE_CAPSULE | Freq: Every day | ORAL | 5 refills | Status: AC

## 2021-01-13 NOTE — Patient Instructions (Addendum)
If you are age 85 or older, your body mass index should be between 23-30. Your Body mass index is 25.42 kg/m. If this is out of the aforementioned range listed, please consider follow up with your Primary Care Provider. __________________________________________________________  The  GI providers would like to encourage you to use [facility] to communicate with providers for non-urgent requests or questions.  Due to long hold times on the telephone, sending your provider a message by [facility] may be a faster and more efficient way to get a response.  Please allow 48 business hours for a response.  Please remember that this is for non-urgent requests.   You have been scheduled for an esophageal manometry test at [facility] on 04/15/2021 at 12:30 pm. Please arrive 30 minutes prior to your procedure for registration. You will need to go to outpatient registration (1st floor of the hospital) first. Make certain to bring your insurance cards as well as a complete list of medications.  Please remember the following:  1) Do not take any muscle relaxants, xanax (alprazolam) or ativan for 1 day prior to your test as well as the day of the test.  2) Nothing to eat or drink after 12:00 midnight on the night before your test.  3) Hold all diabetic medications/insulin the morning of the test. You may eat and take your medications after the test.  It will take at least 2 weeks to receive the results of this test from your physician.  RESTART Dexilant 60 mg 1 tablet daily. *Remember to take your Dexilant the day of your procedure.  Follow up pending the results of your pH with Impedance or as needed.  Thank you for entrusting me with your care and choosing [facility].  Doug Sou, PA-C

## 2021-01-13 NOTE — Progress Notes (Signed)
01/13/2021 Yajaira Doffing [id number] 1932-07-31   HISTORY OF PRESENT ILLNESS: This is an 87 year old female who was new to our office in January when she presented for complaints of chronic cough, which has been present for years. She has tried several inhalers, nasal sprays, cough syrups, allergy medicines, PPIs, etc.  Modified barium swallow study suggested an esophageal source such as dysmotility.  Esophagram showed esophageal dysmotility with signs of esophagitis.  She had stasis in the proximal esophagus which moved to and fro and reflux into the larynx which elicited cough, but no signs of aspiration were noted.  She had reflux into the mid esophagus when recumbent.  She ended up having an esophageal manometry in June, which was normal.  We also wanted her to have pH study with impedance while ON PPI, but there was issues with her transportation so that has not been performed.  She continues with ongoing cough.  She is not currently on PPI therapy as she says that she has run out of all of her prescriptions.   Past Medical History:  Diagnosis Date   Second hand smoke exposure    Past Surgical History:  Procedure Laterality Date   CATARACT EXTRACTION Right 05/01/2013   CATARACT EXTRACTION Left 11/15/2013   ESOPHAGEAL MANOMETRY N/A 09/15/2020   Procedure: ESOPHAGEAL MANOMETRY (EM);  Surgeon: Shellia Cleverly, DO;  Location: [facility];  Service: Endoscopy;  Laterality: N/A;   PH IMPEDANCE STUDY  09/15/2020   Procedure: PH IMPEDANCE STUDY;  Surgeon: Shellia Cleverly, DO;  Location: [facility];  Service: Endoscopy;;   TONSILLECTOMY     age 30   TOTAL HIP ARTHROPLASTY Right 05/28/2013    reports that she has never smoked. She has been exposed to tobacco smoke. She has never used smokeless tobacco. She reports that she does not currently use alcohol. She reports that she does not use drugs. family history includes Breast cancer in her mother; Diabetes in her father. No Known  Allergies    Outpatient Encounter Medications as of 01/13/2021  Medication Sig   benzonatate (TESSALON) 100 MG capsule Take by mouth 2 (two) times daily.   [DISCONTINUED] albuterol (PROVENTIL) (2.5 MG/3ML) 0.083% nebulizer solution Take 3 mLs (2.5 mg total) by nebulization every 6 (six) hours as needed for wheezing or shortness of breath. (Patient not taking: Reported on 01/13/2021)   [DISCONTINUED] albuterol (VENTOLIN HFA) 108 (90 Base) MCG/ACT inhaler Inhale 2 puffs into the lungs every 4 (four) hours as needed for wheezing or shortness of breath. (Patient not taking: Reported on 01/13/2021)   [DISCONTINUED] dexlansoprazole (DEXILANT) 60 MG capsule Take 1 capsule (60 mg total) by mouth daily. (Patient not taking: Reported on 01/13/2021)   [DISCONTINUED] famotidine (PEPCID) 20 MG tablet Take 1 tablet (20 mg total) by mouth at bedtime. (Patient not taking: Reported on 01/13/2021)   [DISCONTINUED] fluticasone (FLONASE) 50 MCG/ACT nasal spray SPRAY INTO NOSE DAILY (Patient not taking: Reported on 01/13/2021)   [DISCONTINUED] Guaifenesin (MUCINEX MAXIMUM STRENGTH) 1200 MG TB12 Take 1 tablet (1,200 mg total) by mouth in the morning and at bedtime. (Patient not taking: Reported on 01/13/2021)   [DISCONTINUED] pantoprazole (PROTONIX) 40 MG tablet Take 1 tablet (40 mg total) by mouth 2 (two) times daily. (Patient not taking: Reported on 01/13/2021)   No facility-administered encounter medications on file as of 01/13/2021.     REVIEW OF SYSTEMS  : All other systems reviewed and negative except where noted in the History of Present Illness.   PHYSICAL EXAM: BP  110/70   Pulse 80   Ht 5\' 2"  (1.575 m)   Wt 139 lb (63 kg)   SpO2 94%   BMI 25.42 kg/m  General: Well developed AA female in no acute distress Head: Normocephalic and atraumatic Eyes:  Sclerae anicteric, conjunctiva pink. Ears: Normal auditory acuity Lungs: Clear throughout to auscultation; no W/R/R. Heart: Regular rate and rhythm; no  M/R/G. Abdomen: Soft, non-distended.  BS present.  Non-tender. Musculoskeletal: Symmetrical with no gross deformities  Skin: No lesions on visible extremities Extremities: No edema  Neurological: Alert oriented x 4, grossly non-focal Psychological:  Alert and cooperative. Normal mood and affect  ASSESSMENT AND PLAN: *Chronic cough: This has been present for years.  Has tried several inhalers, nasal sprays, cough syrups, allergy medicines, PPIs, etc.  Modified barium swallow study suggested an esophageal source such as dysmotility.  Esophagram showed esophageal dysmotility with signs of esophagitis.  She had stasis in the proximal esophagus which moved to and fro and reflux into the larynx which elicited cough, but no signs of aspiration were noted.  She had reflux into the mid esophagus when recumbent.  She ended up having an esophageal manometry in June, which was normal.  We also wanted her to have pH study with impedance while ON PPI, but there was issues with her transportation.  The other option for testing would be the Bravo study, but that would require sedating her and will also lose the impedance part of the study.  She was brought in today to discuss these issues.  We cleared up the transportation issue only to find that the endoscopy unit is no longer performing pH study with impedance until January due to staffing issues.  I discussed with her and she says that she would prefer to wait and just have that performed in January so that way we have all the information we wanted and also she does not want to inconvenience her nephew to have to come with her if we were to perform EGD with the Bravo study.  We are currently planning to schedule the pH with impedance for January and we are also going to arrange for her transportation to come for the study and to return the recorder the following day.  She has run out of refills on all of her PPI medications so is not currently taking anything.  I am  going to restart her Dexilant 60 mg daily and informed her that we want her on that medication for when she comes for her study.  Prescription sent to pharmacy.   CC:  February*

## 2021-01-13 NOTE — Progress Notes (Signed)
It looks like Dr. Barron Alvine has been involved in this case.  I do not perform Bravo placement.  I have not seen the patient, thus, I will allow him to assume her care regarding her work-up of chronic cough etc.  Thanks

## 2021-01-14 NOTE — Progress Notes (Signed)
Agree with the assessment and plan as outlined by Jessica Zehr, PA-C. ? ?Gurkaran Rahm, DO, FACG ? ?

## 2021-01-23 ENCOUNTER — Ambulatory Visit
Admission: RE | Admit: 2021-01-23 | Discharge: 2021-01-23 | Disposition: A | Discharge status: Home / Self Care | Payer: Medicare Other | Source: Ambulatory Visit | Attending: Family Medicine | Admitting: Family Medicine

## 2021-01-23 DIAGNOSIS — R053 Chronic cough: Secondary | ICD-10-CM

## 2021-01-23 DIAGNOSIS — R1310 Dysphagia, unspecified: Secondary | ICD-10-CM

## 2021-01-23 DIAGNOSIS — R911 Solitary pulmonary nodule: Secondary | ICD-10-CM

## 2021-01-23 DIAGNOSIS — R634 Abnormal weight loss: Secondary | ICD-10-CM

## 2021-01-23 MED ORDER — IOPAMIDOL (ISOVUE-300) INJECTION 61%
100.0000 mL | Freq: Once | INTRAVENOUS | Status: AC | PRN
  Administered 2021-01-23: 100 mL via INTRAVENOUS

## 2021-01-27 ENCOUNTER — Other Ambulatory Visit (HOSPITAL_COMMUNITY): Payer: Self-pay | Admitting: Family Medicine

## 2021-01-27 ENCOUNTER — Other Ambulatory Visit: Payer: Self-pay

## 2021-01-27 ENCOUNTER — Ambulatory Visit (HOSPITAL_COMMUNITY): Payer: Medicare Other | Attending: Internal Medicine

## 2021-01-27 DIAGNOSIS — R011 Cardiac murmur, unspecified: Secondary | ICD-10-CM

## 2021-01-27 DIAGNOSIS — R634 Abnormal weight loss: Secondary | ICD-10-CM | POA: Diagnosis not present

## 2021-01-27 DIAGNOSIS — I700 Atherosclerosis of aorta: Secondary | ICD-10-CM

## 2021-01-27 LAB — ECHOCARDIOGRAM LIMITED
Area-P 1/2: 3 cm2
P 1/2 time: 318 msec
S' Lateral: 2.6 cm

## 2021-01-27 NOTE — Progress Notes (Unsigned)
Order is for complete echo not limited

## 2021-04-03 ENCOUNTER — Encounter (HOSPITAL_COMMUNITY): Payer: Self-pay | Admitting: Gastroenterology

## 2021-04-03 NOTE — Progress Notes (Signed)
Attempted to obtain medical history via telephone, unable to reach at this time. Unable to leave voicemail to return pre surgical testing department's phone call.   ?

## 2021-04-15 ENCOUNTER — Ambulatory Visit (HOSPITAL_COMMUNITY)
Admission: RE | Admit: 2021-04-15 | Discharge: 2021-04-15 | Disposition: A | Discharge status: Home / Self Care | Payer: Medicare Other | Attending: Gastroenterology | Admitting: Gastroenterology

## 2021-04-15 ENCOUNTER — Encounter (HOSPITAL_COMMUNITY)
Admission: RE | Disposition: A | Discharge status: Home / Self Care | Payer: Self-pay | Source: Home / Self Care | Attending: Gastroenterology

## 2021-04-15 DIAGNOSIS — K219 Gastro-esophageal reflux disease without esophagitis: Secondary | ICD-10-CM | POA: Insufficient documentation

## 2021-04-15 DIAGNOSIS — R053 Chronic cough: Secondary | ICD-10-CM

## 2021-04-15 DIAGNOSIS — R059 Cough, unspecified: Secondary | ICD-10-CM | POA: Diagnosis present

## 2021-04-15 DIAGNOSIS — R12 Heartburn: Secondary | ICD-10-CM

## 2021-04-15 HISTORY — PX: PH IMPEDANCE STUDY: SHX5565

## 2021-04-15 SURGERY — IMPEDANCE PH STUDY, ESOPHAGUS

## 2021-04-15 MED ORDER — LIDOCAINE VISCOUS HCL 2 % MT SOLN
OROMUCOSAL | Status: AC
  Filled 2021-04-15: qty 15

## 2021-04-15 NOTE — Progress Notes (Signed)
Esophageal Manometry test was done in June 2022. Pt was not able to secure transportation at that time. Just a PH test to be done today using LES markers from [facility] done 6/2022l. Ph impedance probe placed per protocol. Patient tolerated well. At first patient was unsure if she would be able tolerate for 24 hours. Had patient sit in nursing unit for 45 minutes to observe and try out. Pt sipped on soda without trouble and at time of discharge she was not having any issues and was tolerating well.  Education using teach back done regarding Ph impedance monitor and study. Patient voiced and demonstrated understanding . Patient will return tomorrow to have probe removed and monitor downloaded.

## 2021-04-17 ENCOUNTER — Encounter (HOSPITAL_COMMUNITY): Payer: Self-pay | Admitting: Gastroenterology

## 2021-04-27 ENCOUNTER — Telehealth: Payer: Self-pay | Admitting: General Surgery

## 2021-04-27 NOTE — Telephone Encounter (Signed)
Contacted the patient and we discussed her results to the ph impedence. She verbalized understanding and stated that her primary Dr is sending her to a lung specialist. Patient instructed to contact us with an GI needs.

## 2021-04-28 ENCOUNTER — Other Ambulatory Visit: Payer: Self-pay | Admitting: Family Medicine

## 2021-04-28 DIAGNOSIS — R918 Other nonspecific abnormal finding of lung field: Secondary | ICD-10-CM

## 2021-04-28 DIAGNOSIS — R053 Chronic cough: Secondary | ICD-10-CM

## 2021-05-05 DIAGNOSIS — R12 Heartburn: Secondary | ICD-10-CM

## 2021-05-15 ENCOUNTER — Other Ambulatory Visit: Payer: Self-pay

## 2021-05-15 ENCOUNTER — Ambulatory Visit (INDEPENDENT_AMBULATORY_CARE_PROVIDER_SITE_OTHER): Payer: Medicare Other | Admitting: Pulmonary Disease

## 2021-05-15 ENCOUNTER — Encounter: Payer: Self-pay | Admitting: Pulmonary Disease

## 2021-05-15 VITALS — BP 116/42 | HR 82 | Temp 97.8°F | Ht 62.0 in | Wt 142.4 lb

## 2021-05-15 DIAGNOSIS — R093 Abnormal sputum: Secondary | ICD-10-CM | POA: Diagnosis not present

## 2021-05-15 DIAGNOSIS — J479 Bronchiectasis, uncomplicated: Secondary | ICD-10-CM | POA: Diagnosis not present

## 2021-05-15 DIAGNOSIS — R1319 Other dysphagia: Secondary | ICD-10-CM

## 2021-05-15 DIAGNOSIS — J42 Unspecified chronic bronchitis: Secondary | ICD-10-CM | POA: Diagnosis not present

## 2021-05-15 MED ORDER — ALBUTEROL SULFATE (2.5 MG/3ML) 0.083% IN NEBU: 2.5000 mg | INHALATION_SOLUTION | Freq: Four times a day (QID) | RESPIRATORY_TRACT | 12 refills | Status: AC | PRN

## 2021-05-15 NOTE — Progress Notes (Signed)
Synopsis: Referred in Oct 2021 for cough by Buzzy Han*  Subjective:   [name] GENDER: female DOB: January 02, 1933, MRN: [id number]  Chief Complaint  Patient presents with   Follow-up    Follow up.     88 yo FM, PMH no signifigant history except hip surgery and second hand smoke exposure. C/o cough. She has had cough for about 10 years. Her cough occurs mainly at night time. She has episodic flares, thick white mucus. When she drinks water and tea and when she drinks stuff and she coughs. She occasionally gets choked on foods. 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  History is somewhat limited due to the patient's difficulty hearing.  However with talking slow and loudly she is able to understand well.  Her biggest issue is the ongoing trouble with coughing.  She has not had any blood.  Denies hemoptysis.  No recent sick contacts.  Weight has been stable.  OV 08/06/2020: Patient here today for ongoing complaints of cough.  She has difficulty coughing still with eating and drinking.  Liquids seem to cause persistent problem.  I reviewed her recent GI consultation.  She has been on acid suppression with little improvement.  Also reviewed esophagram.  Has a to and fro motion of the fluid collection within the proximal esophagus with swallowing.  Also has esophageal dysmotility on MBS S.  OV 05/15/2021: Here today for follow-up with cough.  Had GI evaluation with esophageal dysmotility. She has lower lobe bronchiectasis.  She has not been doing any of her airways clearance techniques.  Not been using her flutter valve and not been using her nebulizer.  We reviewed her CT results again today in the office.   Past Medical History:  Diagnosis Date   Second hand smoke exposure      Family History  Problem Relation Age of Onset   Breast cancer Mother    Diabetes Father      Past Surgical History:  Procedure Laterality Date    CATARACT EXTRACTION Right 05/01/2013   CATARACT EXTRACTION Left 11/15/2013   ESOPHAGEAL MANOMETRY N/A 09/15/2020   Procedure: ESOPHAGEAL MANOMETRY (EM);  Surgeon: Lavena Bullion, DO;  Location: [facility];  Service: Endoscopy;  Laterality: N/A;   [place] IMPEDANCE STUDY  09/15/2020   Procedure: [place] IMPEDANCE STUDY;  Surgeon: Lavena Bullion, DO;  Location: [facility];  Service: Endoscopy;;   [place] IMPEDANCE STUDY  04/15/2021   Procedure: [place] IMPEDANCE STUDY;  Surgeon: Lavena Bullion, DO;  Location: [facility];  Service: Gastroenterology;;   TONSILLECTOMY     age 41   TOTAL HIP ARTHROPLASTY Right 05/28/2013    Social History   Socioeconomic History   Marital status: Single    Spouse name: Not on file   Number of children: 0   Years of education: Not on file   Highest education level: Not on file  Occupational History   Occupation: retired  Tobacco Use   Smoking status: Never    Passive exposure: Yes   Smokeless tobacco: Never  Vaping Use   Vaping Use: Never used  Substance and Sexual Activity   Alcohol use: Not Currently    Comment: social 10-15 years ago 04/09/2020   Drug use: Never   Sexual activity: Not on file  Other Topics Concern   Not on file  Social History Narrative   Not on file   Social Determinants of Health   Financial Resource Strain: Not  on file  Food Insecurity: Not on file  Transportation Needs: Not on file  Physical Activity: Not on file  Stress: Not on file  Social Connections: Not on file  Intimate Partner Violence: Not on file     No Known Allergies   Outpatient Medications Prior to Visit  Medication Sig Dispense Refill   benzonatate (TESSALON) 100 MG capsule Take by mouth 2 (two) times daily.     pantoprazole (PROTONIX) 40 MG tablet Take 40 mg by mouth daily.     traMADol (ULTRAM) 50 MG tablet Take 50 mg by mouth every 6 (six) hours as needed.     dexlansoprazole (DEXILANT) 60 MG capsule Take 1 capsule (60 mg total) by mouth daily.  (Patient not taking: Reported on 05/15/2021) 60 capsule 5   No facility-administered medications prior to visit.    Review of Systems  Constitutional:  Negative for chills, fever, malaise/fatigue and weight loss.  HENT:  Negative for hearing loss, sore throat and tinnitus.   Eyes:  Negative for blurred vision and double vision.  Respiratory:  Positive for cough and sputum production. Negative for hemoptysis, shortness of breath, wheezing and stridor.   Cardiovascular:  Negative for chest pain, palpitations, orthopnea, leg swelling and PND.  Gastrointestinal:  Negative for abdominal pain, constipation, diarrhea, heartburn, nausea and vomiting.  Genitourinary:  Negative for dysuria, hematuria and urgency.  Musculoskeletal:  Negative for joint pain and myalgias.  Skin:  Negative for itching and rash.  Neurological:  Negative for dizziness, tingling, weakness and headaches.  Endo/Heme/Allergies:  Negative for environmental allergies. Does not bruise/bleed easily.  Psychiatric/Behavioral:  Negative for depression. The patient is not nervous/anxious and does not have insomnia.   All other systems reviewed and are negative.   Objective:  Physical Exam Vitals reviewed.  Constitutional:      General: She is not in acute distress.    Appearance: She is well-developed.  HENT:     Head: Normocephalic and atraumatic.  Eyes:     General: No scleral icterus.    Conjunctiva/sclera: Conjunctivae normal.     Pupils: Pupils are equal, round, and reactive to light.  Neck:     Vascular: No JVD.     Trachea: No tracheal deviation.  Cardiovascular:     Rate and Rhythm: Normal rate and regular rhythm.     Heart sounds: Normal heart sounds. No murmur heard. Pulmonary:     Effort: Pulmonary effort is normal. No tachypnea, accessory muscle usage or respiratory distress.     Breath sounds: Normal breath sounds. No stridor. No wheezing, rhonchi or rales.  Abdominal:     General: Bowel sounds are normal.  There is no distension.     Palpations: Abdomen is soft.     Tenderness: There is no abdominal tenderness.  Musculoskeletal:        General: No tenderness.     Cervical back: Neck supple.  Lymphadenopathy:     Cervical: No cervical adenopathy.  Skin:    General: Skin is warm and dry.     Capillary Refill: Capillary refill takes less than 2 seconds.     Findings: No rash.  Neurological:     Mental Status: She is alert and oriented to person, place, and time.  Psychiatric:        Behavior: Behavior normal.     Vitals:   05/15/21 0859  BP: (!) 116/42  Pulse: 82  Temp: 97.8 F (36.6 C)  TempSrc: Oral  SpO2: 97%  Weight: 142 lb  6.4 oz (64.6 kg)  Height: 5\' 2"  (1.575 m)   97% on RA BMI Readings from Last 3 Encounters:  05/15/21 26.05 kg/m  01/13/21 25.42 kg/m  08/06/20 27.80 kg/m   Wt Readings from Last 3 Encounters:  05/15/21 142 lb 6.4 oz (64.6 kg)  01/13/21 139 lb (63 kg)  08/06/20 152 lb (68.9 kg)     CBC No results found for: WBC, RBC, HGB, HCT, PLT, MCV, MCH, MCHC, RDW, LYMPHSABS, MONOABS, EOSABS, BASOSABS    Chest Imaging: August 2021 CT chest: Bilateral lower lobe tubular bronchiectasis with scattered tree-in-bud. The patient's images have been independently reviewed by me.    Pulmonary Functions Testing Results: No flowsheet data found.  FeNO:   Pathology:   Echocardiogram:   Heart Catheterization:     Assessment & Plan:     ICD-10-CM   1. Bronchiectasis without complication ([place])  A999333     2. Esophageal dysphagia  R13.19     3. Increased sputum production  R09.3     4. Chronic bronchitis, unspecified chronic bronchitis type ([place])  J42        Discussion:  This is an 88 year old female, difficulty hearing, ongoing issues regarding the cough.  She has used multiple empiric treatments as well as bronchodilators.  She had an MBSS and a barium esophagram.  She likely has some mild esophageal dysfunction.  She has lower lobe associated  bronchiectasis which I presume is related to ongoing small amounts of aspiration.  She does not use her inhalers.  She does not like using her nebulizer.  She still complains of cough.  Most of this I think would improve with better airway clearance techniques  Plan: Patient was counseled today in the office on use of her flutter valve as well as I-S. Patient was also counseled on use of her nebulizer in the morning and evening. I explained that her cough is going to be present especially with lower lobe bronchiectasis she is going to have a cough and that she should continue to do that to help clear her airway out. She has not had any significant exacerbations in the past year. New prescriptions given for albuterol nebs. We will follow her in clinic   Current Outpatient Medications:    benzonatate (TESSALON) 100 MG capsule, Take by mouth 2 (two) times daily., Disp: , Rfl:    pantoprazole (PROTONIX) 40 MG tablet, Take 40 mg by mouth daily., Disp: , Rfl:    traMADol (ULTRAM) 50 MG tablet, Take 50 mg by mouth every 6 (six) hours as needed., Disp: , Rfl:    dexlansoprazole (DEXILANT) 60 MG capsule, Take 1 capsule (60 mg total) by mouth daily. (Patient not taking: Reported on 05/15/2021), Disp: 60 capsule, Rfl: 5    Garner Nash, DO [facility] 05/15/2021 9:22 AM

## 2021-05-15 NOTE — Patient Instructions (Signed)
Thank you for visiting Dr. Tonia Brooms at [facility]. Today we recommend the following:  Albuterol nebulizer twice per day  Flutter valve use daily   Return in about 1 year (around 05/15/2022) for with APP or Dr. Tonia Brooms.    Please do your part to reduce the spread of COVID-19.

## 2021-05-15 NOTE — Addendum Note (Signed)
Addended by: Arlyss Repress on: 05/15/2021 09:24 AM   Modules accepted: Orders

## 2021-05-20 ENCOUNTER — Ambulatory Visit
Admission: RE | Admit: 2021-05-20 | Discharge: 2021-05-20 | Disposition: A | Discharge status: Home / Self Care | Payer: Medicare Other | Source: Ambulatory Visit | Attending: Family Medicine | Admitting: Family Medicine

## 2021-05-20 DIAGNOSIS — R918 Other nonspecific abnormal finding of lung field: Secondary | ICD-10-CM

## 2021-05-20 DIAGNOSIS — R053 Chronic cough: Secondary | ICD-10-CM

## 2021-10-02 ENCOUNTER — Ambulatory Visit
Admission: RE | Admit: 2021-10-02 | Discharge: 2021-10-02 | Disposition: A | Discharge status: Home / Self Care | Payer: Medicare Other | Source: Ambulatory Visit | Attending: Student in an Organized Health Care Education/Training Program | Admitting: Student in an Organized Health Care Education/Training Program

## 2021-10-02 ENCOUNTER — Other Ambulatory Visit: Payer: Self-pay | Admitting: Student in an Organized Health Care Education/Training Program

## 2021-10-02 DIAGNOSIS — R059 Cough, unspecified: Secondary | ICD-10-CM

## 2021-11-19 ENCOUNTER — Encounter: Payer: Self-pay | Admitting: Pulmonary Disease

## 2021-11-19 ENCOUNTER — Ambulatory Visit (INDEPENDENT_AMBULATORY_CARE_PROVIDER_SITE_OTHER): Payer: Medicare Other | Admitting: Pulmonary Disease

## 2021-11-19 VITALS — BP 100/60 | HR 90 | Ht 62.0 in | Wt 133.6 lb

## 2021-11-19 DIAGNOSIS — R093 Abnormal sputum: Secondary | ICD-10-CM | POA: Diagnosis not present

## 2021-11-19 DIAGNOSIS — J42 Unspecified chronic bronchitis: Secondary | ICD-10-CM | POA: Diagnosis not present

## 2021-11-19 DIAGNOSIS — R053 Chronic cough: Secondary | ICD-10-CM

## 2021-11-19 DIAGNOSIS — J479 Bronchiectasis, uncomplicated: Secondary | ICD-10-CM

## 2021-11-19 DIAGNOSIS — R1319 Other dysphagia: Secondary | ICD-10-CM | POA: Diagnosis not present

## 2021-11-19 NOTE — Progress Notes (Signed)
Synopsis: Referred in Oct 2021 for cough by Lula Olszewski, MD  Subjective:   [name] GENDER: female DOB: February 19, 1933, MRN: [id number]  Chief Complaint  Patient presents with   Follow-up    Follow-up: Coughing, medication is not helping.    88 yo FM, PMH no signifigant history except hip surgery and second hand smoke exposure. C/o cough. She has had cough for about 10 years. Her cough occurs mainly at night time. She has episodic flares, thick white mucus. When she drinks water and tea and when she drinks stuff and she coughs. She occasionally gets choked on foods. 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  History is somewhat limited due to the patient's difficulty hearing.  However with talking slow and loudly she is able to understand well.  Her biggest issue is the ongoing trouble with coughing.  She has not had any blood.  Denies hemoptysis.  No recent sick contacts.  Weight has been stable.  OV 08/06/2020: Patient here today for ongoing complaints of cough.  She has difficulty coughing still with eating and drinking.  Liquids seem to cause persistent problem.  I reviewed her recent GI consultation.  She has been on acid suppression with little improvement.  Also reviewed esophagram.  Has a to and fro motion of the fluid collection within the proximal esophagus with swallowing.  Also has esophageal dysmotility on MBS S.  OV 05/15/2021: Here today for follow-up with cough.  Had GI evaluation with esophageal dysmotility. She has lower lobe bronchiectasis.  She has not been doing any of her airways clearance techniques.  Not been using her flutter valve and not been using her nebulizer.  We reviewed her CT results again today in the office.  OV 11/19/2021: Here today for follow-up with cough.  She has had GI evaluation in the past documenting esophageal dysmotility.  She has lower lobe bronchiectasis consistent with recurrent aspiration into  the airway.Last seen in the office in February 2023.  Asked to be seen today after she had an episode with therapist where she was drinking fluid and got strangled and was coughing for about 30 minutes afterwards.  She is still coughing all of the time.  I expect that she is aspirating.  We had an in-depth discussion today with her nephew regarding management of bronchiectasis and what that takes.  We talked about the pros and cons of using opiates to blunt the cough reflex.  We talked about the use of vest therapy.  She does think the able to do that since she lives alone.    Past Medical History:  Diagnosis Date   Second hand smoke exposure      Family History  Problem Relation Age of Onset   Breast cancer Mother    Diabetes Father      Past Surgical History:  Procedure Laterality Date   CATARACT EXTRACTION Right 05/01/2013   CATARACT EXTRACTION Left 11/15/2013   ESOPHAGEAL MANOMETRY N/A 09/15/2020   Procedure: ESOPHAGEAL MANOMETRY (EM);  Surgeon: Shellia Cleverly, DO;  Location: [facility];  Service: Endoscopy;  Laterality: N/A;   PH IMPEDANCE STUDY  09/15/2020   Procedure: PH IMPEDANCE STUDY;  Surgeon: Shellia Cleverly, DO;  Location: [facility];  Service: Endoscopy;;   PH IMPEDANCE STUDY  04/15/2021   Procedure: PH IMPEDANCE STUDY;  Surgeon: Shellia Cleverly, DO;  Location: [facility];  Service: Gastroenterology;;   TONSILLECTOMY     age 8  TOTAL HIP ARTHROPLASTY Right 05/28/2013    Social History   Socioeconomic History   Marital status: Single    Spouse name: Not on file   Number of children: 0   Years of education: Not on file   Highest education level: Not on file  Occupational History   Occupation: retired  Tobacco Use   Smoking status: Never    Passive exposure: Yes   Smokeless tobacco: Never  Vaping Use   Vaping Use: Never used  Substance and Sexual Activity   Alcohol use: Not Currently    Comment: social 10-15 years ago 04/09/2020   Drug use:  Never   Sexual activity: Not on file  Other Topics Concern   Not on file  Social History Narrative   Not on file   Social Determinants of Health   Financial Resource Strain: Not on file  Food Insecurity: Not on file  Transportation Needs: Not on file  Physical Activity: Not on file  Stress: Not on file  Social Connections: Not on file  Intimate Partner Violence: Not on file     No Known Allergies   Outpatient Medications Prior to Visit  Medication Sig Dispense Refill   albuterol (VENTOLIN HFA) 108 (90 Base) MCG/ACT inhaler SMARTSIG:2 Puff(s) By Mouth Every 4-6 Hours PRN     SYMBICORT 160-4.5 MCG/ACT inhaler SMARTSIG:2 Puff(s) By Mouth Twice Daily     albuterol (PROVENTIL) (2.5 MG/3ML) 0.083% nebulizer solution Take 3 mLs (2.5 mg total) by nebulization every 6 (six) hours as needed for wheezing or shortness of breath. (Patient not taking: Reported on 11/19/2021) 75 mL 12   benzonatate (TESSALON) 100 MG capsule Take by mouth 2 (two) times daily.     dexlansoprazole (DEXILANT) 60 MG capsule Take 1 capsule (60 mg total) by mouth daily. (Patient not taking: Reported on 05/15/2021) 60 capsule 5   pantoprazole (PROTONIX) 40 MG tablet Take 40 mg by mouth daily.     traMADol (ULTRAM) 50 MG tablet Take 50 mg by mouth every 6 (six) hours as needed.     No facility-administered medications prior to visit.    Review of Systems  Constitutional:  Negative for chills, fever, malaise/fatigue and weight loss.  HENT:  Negative for hearing loss, sore throat and tinnitus.   Eyes:  Negative for blurred vision and double vision.  Respiratory:  Positive for cough and sputum production. Negative for hemoptysis, shortness of breath, wheezing and stridor.   Cardiovascular:  Negative for chest pain, palpitations, orthopnea, leg swelling and PND.  Gastrointestinal:  Negative for abdominal pain, constipation, diarrhea, heartburn, nausea and vomiting.  Genitourinary:  Negative for dysuria, hematuria and  urgency.  Musculoskeletal:  Negative for joint pain and myalgias.  Skin:  Negative for itching and rash.  Neurological:  Negative for dizziness, tingling, weakness and headaches.  Endo/Heme/Allergies:  Negative for environmental allergies. Does not bruise/bleed easily.  Psychiatric/Behavioral:  Negative for depression. The patient is not nervous/anxious and does not have insomnia.   All other systems reviewed and are negative.    Objective:  Physical Exam Vitals reviewed.  Constitutional:      General: She is not in acute distress.    Appearance: She is well-developed.  HENT:     Head: Normocephalic and atraumatic.  Eyes:     General: No scleral icterus.    Conjunctiva/sclera: Conjunctivae normal.     Pupils: Pupils are equal, round, and reactive to light.  Neck:     Vascular: No JVD.     Trachea:  No tracheal deviation.  Cardiovascular:     Rate and Rhythm: Normal rate and regular rhythm.     Heart sounds: Normal heart sounds. No murmur heard. Pulmonary:     Effort: Pulmonary effort is normal. No tachypnea, accessory muscle usage or respiratory distress.     Breath sounds: No stridor. Rhonchi present. No wheezing or rales.  Abdominal:     General: There is no distension.     Palpations: Abdomen is soft.     Tenderness: There is no abdominal tenderness.  Musculoskeletal:        General: No tenderness.     Cervical back: Neck supple.  Lymphadenopathy:     Cervical: No cervical adenopathy.  Skin:    General: Skin is warm and dry.     Capillary Refill: Capillary refill takes less than 2 seconds.     Findings: No rash.  Neurological:     Mental Status: She is alert and oriented to person, place, and time.  Psychiatric:        Behavior: Behavior normal.      Vitals:   11/19/21 1008  BP: 100/60  Pulse: 90  SpO2: 97%  Weight: 133 lb 9.6 oz (60.6 kg)  Height: 5\' 2"  (1.575 m)   97% on RA BMI Readings from Last 3 Encounters:  11/19/21 24.44 kg/m  05/15/21 26.05  kg/m  01/13/21 25.42 kg/m   Wt Readings from Last 3 Encounters:  11/19/21 133 lb 9.6 oz (60.6 kg)  05/15/21 142 lb 6.4 oz (64.6 kg)  01/13/21 139 lb (63 kg)     CBC No results found for: "WBC", "RBC", "HGB", "HCT", "PLT", "MCV", "MCH", "MCHC", "RDW", "LYMPHSABS", "MONOABS", "EOSABS", "BASOSABS"    Chest Imaging: August 2021 CT chest: Bilateral lower lobe tubular bronchiectasis with scattered tree-in-bud. The patient's images have been independently reviewed by me.    Pulmonary Functions Testing Results:     No data to display          FeNO:   Pathology:   Echocardiogram:   Heart Catheterization:     Assessment & Plan:     ICD-10-CM   1. Bronchiectasis without complication (HCC)  J47.9     2. Esophageal dysphagia  R13.19     3. Increased sputum production  R09.3     4. Chronic bronchitis, unspecified chronic bronchitis type (HCC)  J42     5. Chronic cough  R05.3         Discussion:  This is an 88 year old female with difficulty hearing, ongoing issues related to chronic cough.  She has been on empiric bronchodilators she had an MBSS and a barium esophagram shows mild esophageal dysmotility.  She has a CT scan that demonstrates lower lobe bronchiectasis.  She has had multiple encounters documented where she had aspiration events while eating and drinking and coughing afterward.  Plan: I think she is aspirating on a daily basis when she eats. She needs to have a cough to be able to clear her airways. She has dilated bronchiectasis in her lower lobes. This is not going to get better I showed the images to her nephew and reviewed treatment regimen. This is predominantly a mechanical problem for her. She needs to use her flutter valve daily. We talked about the use of vest therapy but they do not believe she is able to do this since she lives alone. She needs to continue her current inhaler regimen. We talked about the use of opiates to blunt the cough  reflex  but she needs to cough to clear her airways and there is pros and cons associated with the use of opiates.  Since she lives alone they feel like the use of opiates to help blunt cough reflex is also not a good idea because it may increase her risk of falling. I agree with this. She can follow-up with [place] in a couple of months for management otherwise continue the current inhaler regimen, bronchodilators and daily flutter valve therapy.   Current Outpatient Medications:    albuterol (VENTOLIN HFA) 108 (90 Base) MCG/ACT inhaler, SMARTSIG:2 Puff(s) By Mouth Every 4-6 Hours PRN, Disp: , Rfl:    SYMBICORT 160-4.5 MCG/ACT inhaler, SMARTSIG:2 Puff(s) By Mouth Twice Daily, Disp: , Rfl:    albuterol (PROVENTIL) (2.5 MG/3ML) 0.083% nebulizer solution, Take 3 mLs (2.5 mg total) by nebulization every 6 (six) hours as needed for wheezing or shortness of breath. (Patient not taking: Reported on 11/19/2021), Disp: 75 mL, Rfl: 12   benzonatate (TESSALON) 100 MG capsule, Take by mouth 2 (two) times daily., Disp: , Rfl:    dexlansoprazole (DEXILANT) 60 MG capsule, Take 1 capsule (60 mg total) by mouth daily. (Patient not taking: Reported on 05/15/2021), Disp: 60 capsule, Rfl: 5   pantoprazole (PROTONIX) 40 MG tablet, Take 40 mg by mouth daily., Disp: , Rfl:    traMADol (ULTRAM) 50 MG tablet, Take 50 mg by mouth every 6 (six) hours as needed., Disp: , Rfl:     Josephine Igo, DO [facility] 11/19/2021 10:35 AM

## 2021-11-19 NOTE — Patient Instructions (Signed)
Thank you for visiting Dr. Tonia Brooms at [facility]. Today we recommend the following:  Continue flutter valve daily  Continue current inhaler regimen   Return in about 6 months (around 05/22/2022) for with APP.    Please do your part to reduce the spread of COVID-19.

## 2022-07-26 ENCOUNTER — Telehealth: Payer: Self-pay

## 2022-07-26 ENCOUNTER — Telehealth: Payer: Self-pay | Admitting: Primary Care

## 2022-07-26 ENCOUNTER — Other Ambulatory Visit: Payer: Self-pay | Admitting: Orthopedic Surgery

## 2022-07-26 DIAGNOSIS — M25552 Pain in left hip: Secondary | ICD-10-CM

## 2022-07-26 NOTE — Telephone Encounter (Signed)
Closing encounter since another one is opened

## 2022-07-26 NOTE — Telephone Encounter (Signed)
Fax received from Dr. Gean Birchwood with [facility] to perform a Left Total Anterior Hip Arthroplasty on patient.  Patient needs surgery clearance. Surgery is Pending. Patient was seen on 11/19/21. Office protocol is a risk assessment can be sent to surgeon if patient has been seen in 60 days or less.   Sending to Buelah Manis for risk assessment or recommendations if patient needs to be seen in office prior to surgical procedure.    Beth patient has been scheduled for OV on 08/05/2022

## 2022-07-26 NOTE — Telephone Encounter (Addendum)
ATC X1 LVM for patient to call the office back. Please schedule with an app for surgical clearance.

## 2022-07-26 NOTE — Telephone Encounter (Signed)
Pt called the office and I scheduled her for a surgical clearance appt.  Routing back to surgical clearance pool.

## 2022-08-03 NOTE — Progress Notes (Unsigned)
@Patient  ID: Melinda Valencia, female    DOB: 1933-01-26, 89 y.o.   MRN: [id number]  No chief complaint on file.   Referring provider: Lula Olszewski, MD  HPI: 89 year old female, never smoked.  Past medical history significant for bronchiectasis, GERD, dysphagia.  Patient of Dr. Tonia Brooms, last seen in office on 11/19/2021.   Patient has chronic cough.  Modified barium swallow showed mild esophageal dysmotility.  CT scan demonstrated lower lobe bronchiectasis. At risk for aspiration.    08/05/2022 Patient presents today for surgical clearance.  Maintained on Symbicort and prn ventolin hfa   Flutter valve daily       Imaging: 10/02/21  CXR>> clear lungs, no active cardiopulmonary disease   No Known Allergies  Immunization History  Administered Date(s) Administered   Fluad Quad(high Dose 65+) 01/14/2020   Linwood Dibbles ([employer]) SARS-COV-2 Vaccination 07/13/2019    Past Medical History:  Diagnosis Date   Second hand smoke exposure     Tobacco History: Social History   Tobacco Use  Smoking Status Never   Passive exposure: Yes  Smokeless Tobacco Never   Counseling given: Not Answered   Outpatient Medications Prior to Visit  Medication Sig Dispense Refill   albuterol (PROVENTIL) (2.5 MG/3ML) 0.083% nebulizer solution Take 3 mLs (2.5 mg total) by nebulization every 6 (six) hours as needed for wheezing or shortness of breath. (Patient not taking: Reported on 11/19/2021) 75 mL 12   albuterol (VENTOLIN HFA) 108 (90 Base) MCG/ACT inhaler SMARTSIG:2 Puff(s) By Mouth Every 4-6 Hours PRN     benzonatate (TESSALON) 100 MG capsule Take by mouth 2 (two) times daily.     dexlansoprazole (DEXILANT) 60 MG capsule Take 1 capsule (60 mg total) by mouth daily. (Patient not taking: Reported on 05/15/2021) 60 capsule 5   pantoprazole (PROTONIX) 40 MG tablet Take 40 mg by mouth daily.     SYMBICORT 160-4.5 MCG/ACT inhaler SMARTSIG:2 Puff(s) By Mouth Twice Daily     traMADol (ULTRAM) 50 MG tablet Take  50 mg by mouth every 6 (six) hours as needed.     No facility-administered medications prior to visit.      Review of Systems  Review of Systems   Physical Exam  There were no vitals taken for this visit. Physical Exam   Lab Results:  CBC No results found for: "WBC", "RBC", "HGB", "HCT", "PLT", "MCV", "MCH", "MCHC", "RDW", "LYMPHSABS", "MONOABS", "EOSABS", "BASOSABS"  BMET No results found for: "NA", "K", "CL", "CO2", "GLUCOSE", "BUN", "CREATININE", "CALCIUM", "GFRNONAA", "GFRAA"  BNP No results found for: "BNP"  ProBNP No results found for: "PROBNP"  Imaging: No results found.   Assessment & Plan:   No problem-specific Assessment & Plan notes found for this encounter.    1) RISK FOR PROLONGED MECHANICAL VENTILAION - > 48h  1A) Arozullah - Prolonged mech ventilation risk Arozullah Postperative Pulmonary Risk Score - for mech ventilation dependence >48h USAA, Ann Surg 2000, major non-cardiac surgery) Comment Score  Type of surgery - abd ao aneurysm (27), thoracic (21), neurosurgery / upper abdominal / vascular (21), neck (11) *** ***  Emergency Surgery - (11)    ALbumin < 3 or poor nutritional state - (9)    BUN > 30 -  (8)    Partial or completely dependent functional status - (7)    COPD -  (6)    Age - 60 to 69 (4), > 70  (6)    TOTAL    Risk Stratifcation scores  - < 10 (0.5%), 11-19 (1.8%),  20-27 (4.2%), 28-40 (10.1%), >40 (26.6%)        1B) GUPTA - Prolonged Mech Vent Risk Score source Risk  Guptal post op prolonged mech ventilation > 48h or reintubation < 30 days - ACS 2007-2008 dataset - [employer] ***    2) RISK FOR POST OP PNEUMONIA Score source Risk  Chales Abrahams - Post Op Pnemounia risk  [email or web address] ***    R3) ISK FOR ANY POST-OP PULMONARY COMPLICATION Score source Risk  CANET/ARISCAT Score - risk for ANY/ALl pulmonary  complications - > risk of in-hospital post-op pulmonary complications (composite including respiratory failure, respiratory infection, pleural effusion, atelectasis, pneumothorax, bronchospasm, aspiration pneumonitis) [employer] - based on age, anemia, pulse ox, resp infection prior 30d, incision site, duration of surgery, and emergency v elective surgery ***     Glenford Bayley, NP 08/03/2022

## 2022-08-05 ENCOUNTER — Ambulatory Visit (INDEPENDENT_AMBULATORY_CARE_PROVIDER_SITE_OTHER): Payer: 59 | Admitting: Primary Care

## 2022-08-05 ENCOUNTER — Encounter: Payer: Self-pay | Admitting: Primary Care

## 2022-08-05 ENCOUNTER — Ambulatory Visit (INDEPENDENT_AMBULATORY_CARE_PROVIDER_SITE_OTHER): Payer: 59

## 2022-08-05 VITALS — BP 110/50 | HR 80 | Temp 98.0°F | Ht 62.0 in | Wt 129.6 lb

## 2022-08-05 DIAGNOSIS — J479 Bronchiectasis, uncomplicated: Secondary | ICD-10-CM | POA: Diagnosis not present

## 2022-08-05 DIAGNOSIS — Z01811 Encounter for preprocedural respiratory examination: Secondary | ICD-10-CM

## 2022-08-05 MED ORDER — GUAIFENESIN ER 600 MG PO TB12: 600.0000 mg | ORAL_TABLET | Freq: Two times a day (BID) | ORAL | 2 refills | Status: DC

## 2022-08-05 NOTE — Patient Instructions (Addendum)
We have to check a chest xray and do a breathing test before we can clear you for surgery You will likely be moderate-high risk for prolonged mechanical ventilation and/or post op respiratory issues due to age and lung disease   Recommendations: Take mucinex 600mg  morning and evening (prescription sent)  Start using flutter valve three times a day   Orders: Flutter valve CXR today (ordered)  Spirometry with DLCO (ordered)   Follow-up: 3 months with Dr. Tonia Brooms

## 2022-08-05 NOTE — Telephone Encounter (Signed)
OV notes and clearance form have been faxed back to [facility]. Nothing further needed at this time.  

## 2022-08-05 NOTE — Assessment & Plan Note (Signed)
-   Chronic cough. No recent exacerbations requiring antibiotics.  - Recommend she start taking mucinex 600mg  twice daily and use flutter valve three times daily - Needs pre-op CXR and basic spirometry prior to clearance for hip surgery

## 2022-08-05 NOTE — Assessment & Plan Note (Addendum)
-   Patient followed by pulmonary office for history of bronchiectasis.  She has a chronic cough without purulent sputum.  No associated dyspnea or recent exacerbations.  Exam benign.  Oxygen saturation 99% room air.  She need preop chest x-ray and basic spirometry prior to hip surgery.  She will likely be moderate to high risk for prolonged mechanical ventilation and or postop pulmonary complications due to her age and underlying lung condition.  Ultimate clearance will be decided upon by surgeon and anesthesiology.  Major Pulmonary risks identified in the multifactorial risk analysis are but not limited to a) pneumonia; b) recurrent intubation risk; c) prolonged or recurrent acute respiratory failure needing mechanical ventilation; d) prolonged hospitalization; e) DVT/Pulmonary embolism; f) Acute Pulmonary edema  Recommend 1. Short duration of surgery as much as possible and avoid paralytic if possible 2. Recovery in step down or ICU with Pulmonary consultation if needed 3. DVT prophylaxis 4. Aggressive pulmonary toilet with o2, bronchodilatation, and incentive spirometry and early ambulation

## 2022-08-09 NOTE — Progress Notes (Signed)
Please let patient know pre-op CXR looked fine, no acute process. Bronchiectasis not seen on plain chest film. Degenerative changes of her spine and right shoulder. Once she has done spirometry we can clear her

## 2022-08-23 ENCOUNTER — Other Ambulatory Visit: Payer: Self-pay | Admitting: Orthopedic Surgery

## 2022-08-24 ENCOUNTER — Ambulatory Visit (INDEPENDENT_AMBULATORY_CARE_PROVIDER_SITE_OTHER): Payer: 59 | Admitting: Pulmonary Disease

## 2022-08-24 DIAGNOSIS — J479 Bronchiectasis, uncomplicated: Secondary | ICD-10-CM

## 2022-08-24 DIAGNOSIS — Z01811 Encounter for preprocedural respiratory examination: Secondary | ICD-10-CM

## 2022-08-24 LAB — PULMONARY FUNCTION TEST
DL/VA % pred: 96 %
DL/VA: 3.97 ml/min/mmHg/L
DLCO cor % pred: 70 %
DLCO cor: 12.11 ml/min/mmHg
DLCO unc % pred: 70 %
DLCO unc: 12.11 ml/min/mmHg
FEF 25-75 Pre: 0.97 L/sec
FEF2575-%Pred-Pre: 104 %
FEV1-%Pred-Pre: 92 %
FEV1-Pre: 1.39 L
FEV1FVC-%Pred-Pre: 94 %
FEV6-%Pred-Pre: 104 %
FEV6-Pre: 2.01 L
FEV6FVC-%Pred-Pre: 106 %
FVC-%Pred-Pre: 98 %
FVC-Pre: 2.02 L
Pre FEV1/FVC ratio: 69 %
Pre FEV6/FVC Ratio: 99 %

## 2022-08-24 NOTE — Progress Notes (Signed)
Spiro/DLCO performed today.  

## 2022-08-24 NOTE — Patient Instructions (Signed)
Spiro/DLCO performed today.  

## 2022-08-25 ENCOUNTER — Telehealth: Payer: Self-pay | Admitting: Pulmonary Disease

## 2022-08-25 NOTE — Telephone Encounter (Signed)
Patient is returning phone call. Patient phone number is [phone].

## 2022-08-27 NOTE — Telephone Encounter (Signed)
Pt was notified of the results from recent PFT. Nothing further needed.

## 2022-09-10 NOTE — Patient Instructions (Signed)
DUE TO COVID-19 ONLY TWO VISITORS  (aged 87 and older)  ARE ALLOWED TO COME WITH YOU AND STAY IN THE WAITING ROOM ONLY DURING PRE OP AND PROCEDURE.   **NO VISITORS ARE ALLOWED IN THE SHORT STAY AREA OR RECOVERY ROOM!!**  IF YOU WILL BE ADMITTED INTO THE HOSPITAL YOU ARE ALLOWED ONLY FOUR SUPPORT PEOPLE DURING VISITATION HOURS ONLY (7 AM -8PM)   The support person(s) must pass our screening, gel in and out, and wear a mask at all times, including in the patient's room. Patients must also wear a mask when staff or their support person are in the room. Visitors GUEST BADGE MUST BE WORN VISIBLY  One adult visitor may remain with you overnight and MUST be in the room by 8 P.M.     Your procedure is scheduled on: 09/27/22   Report to [facility] Main Entrance    Report to admitting at : 10:00 AM   Call this number if you have problems the morning of surgery [phone]   Do not eat food :After Midnight.   After Midnight you may have the following liquids until : 9:30 AM DAY OF SURGERY  Water Black Coffee (sugar ok, NO MILK/CREAM OR CREAMERS)  Tea (sugar ok, NO MILK/CREAM OR CREAMERS) regular and decaf                             Plain Jell-O (NO RED)                                           Fruit ices (not with fruit pulp, NO RED)                                     Popsicles (NO RED)                                                                  Juice: apple, WHITE grape, WHITE cranberry Sports drinks like Gatorade (NO RED)   The day of surgery:  Drink ONE (1) Pre-Surgery Clear Ensure at : 9:30 AM the morning of surgery. Drink in one sitting. Do not sip.  This drink was given to you during your hospital  pre-op appointment visit. Nothing else to drink after completing the  Pre-Surgery Clear Ensure or G2.          If you have questions, please contact your surgeon's office.   Oral Hygiene is also important to reduce your risk of infection.                                     Remember - BRUSH YOUR TEETH THE MORNING OF SURGERY WITH YOUR REGULAR TOOTHPASTE  DENTURES WILL BE REMOVED PRIOR TO SURGERY PLEASE DO NOT APPLY "Poly grip" OR ADHESIVES!!!   Do NOT smoke after Midnight   Take these medicines the morning of surgery with A SIP OF WATER: pantoprazole,tessalon,mucinex.Use inhalers as usual.  You may not have any metal on your body including hair pins, jewelry, and body piercing             Do not wear make-up, lotions, powders, perfumes/cologne, or deodorant  Do not wear nail polish including gel and S&S, artificial/acrylic nails, or any other type of covering on natural nails including finger and toenails. If you have artificial nails, gel coating, etc. that needs to be removed by a nail salon please have this removed prior to surgery or surgery may need to be canceled/ delayed if the surgeon/ anesthesia feels like they are unable to be safely monitored.   Do not shave  48 hours prior to surgery.    Do not bring valuables to the hospital. [facility] IS NOT             RESPONSIBLE   FOR VALUABLES.   Contacts, glasses, or bridgework may not be worn into surgery.   Bring small overnight bag day of surgery.   DO NOT BRING YOUR HOME MEDICATIONS TO THE HOSPITAL. PHARMACY WILL DISPENSE MEDICATIONS LISTED ON YOUR MEDICATION LIST TO YOU DURING YOUR ADMISSION IN THE HOSPITAL!    Patients discharged on the day of surgery will not be allowed to drive home.  Someone NEEDS to stay with you for the first 24 hours after anesthesia.   Special Instructions: Bring a copy of your healthcare power of attorney and living will documents         the day of surgery if you haven't scanned them before.              Please read over the following fact sheets you were given: IF YOU HAVE QUESTIONS ABOUT YOUR PRE-OP INSTRUCTIONS PLEASE CALL [phone]      Pre-operative 5 CHG Bath Instructions   You can play a key role in reducing the risk of  infection after surgery. Your skin needs to be as free of germs as possible. You can reduce the number of germs on your skin by washing with CHG (chlorhexidine gluconate) soap before surgery. CHG is an antiseptic soap that kills germs and continues to kill germs even after washing.   DO NOT use if you have an allergy to chlorhexidine/CHG or antibacterial soaps. If your skin becomes reddened or irritated, stop using the CHG and notify one of our RNs at [phone].   Please shower with the CHG soap starting 4 days before surgery using the following schedule:     Please keep in mind the following:  DO NOT shave, including legs and underarms, starting the day of your first shower.   You may shave your face at any point before/day of surgery.  Place clean sheets on your bed the day you start using CHG soap. Use a clean washcloth (not used since being washed) for each shower. DO NOT sleep with pets once you start using the CHG.   CHG Shower Instructions:  If you choose to wash your hair and private area, wash first with your normal shampoo/soap.  After you use shampoo/soap, rinse your hair and body thoroughly to remove shampoo/soap residue.  Turn the water OFF and apply about 3 tablespoons (45 ml) of CHG soap to a CLEAN washcloth.  Apply CHG soap ONLY FROM YOUR NECK DOWN TO YOUR TOES (washing for 3-5 minutes)  DO NOT use CHG soap on face, private areas, open wounds, or sores.  Pay special attention to the area where your surgery is being performed.  If  you are having back surgery, having someone wash your back for you may be helpful. Wait 2 minutes after CHG soap is applied, then you may rinse off the CHG soap.  Pat dry with a clean towel  Put on clean clothes/pajamas   If you choose to wear lotion, please use ONLY the CHG-compatible lotions on the back of this paper.     Additional instructions for the day of surgery: DO NOT APPLY any lotions, deodorants, cologne, or perfumes.   Put on  clean/comfortable clothes.  Brush your teeth.  Ask your nurse before applying any prescription medications to the skin.      CHG Compatible Lotions   Aveeno Moisturizing lotion  Cetaphil Moisturizing Cream  Cetaphil Moisturizing Lotion  Clairol Herbal Essence Moisturizing Lotion, Dry Skin  Clairol Herbal Essence Moisturizing Lotion, Extra Dry Skin  Clairol Herbal Essence Moisturizing Lotion, Normal Skin  Curel Age Defying Therapeutic Moisturizing Lotion with Alpha Hydroxy  Curel Extreme Care Body Lotion  Curel Soothing Hands Moisturizing Hand Lotion  Curel Therapeutic Moisturizing Cream, Fragrance-Free  Curel Therapeutic Moisturizing Lotion, Fragrance-Free  Curel Therapeutic Moisturizing Lotion, Original Formula  Eucerin Daily Replenishing Lotion  Eucerin Dry Skin Therapy Plus Alpha Hydroxy Crme  Eucerin Dry Skin Therapy Plus Alpha Hydroxy Lotion  Eucerin Original Crme  Eucerin Original Lotion  Eucerin Plus Crme Eucerin Plus Lotion  Eucerin TriLipid Replenishing Lotion  Keri Anti-Bacterial Hand Lotion  Keri Deep Conditioning Original Lotion Dry Skin Formula Softly Scented  Keri Deep Conditioning Original Lotion, Fragrance Free Sensitive Skin Formula  Keri Lotion Fast Absorbing Fragrance Free Sensitive Skin Formula  Keri Lotion Fast Absorbing Softly Scented Dry Skin Formula  Keri Original Lotion  Keri Skin Renewal Lotion Keri Silky Smooth Lotion  Keri Silky Smooth Sensitive Skin Lotion  Nivea Body Creamy Conditioning Oil  Nivea Body Extra Enriched Lotion  Nivea Body Original Lotion  Nivea Body Sheer Moisturizing Lotion Nivea Crme  Nivea Skin Firming Lotion  NutraDerm 30 Skin Lotion  NutraDerm Skin Lotion  NutraDerm Therapeutic Skin Cream  NutraDerm Therapeutic Skin Lotion  ProShield Protective Hand Cream  Provon moisturizing lotion   Incentive Spirometer  An incentive spirometer is a tool that can help keep your lungs clear and active. This tool measures how well  you are filling your lungs with each breath. Taking long deep breaths may help reverse or decrease the chance of developing breathing (pulmonary) problems (especially infection) following: A long period of time when you are unable to move or be active. BEFORE THE PROCEDURE  If the spirometer includes an indicator to show your best effort, your nurse or respiratory therapist will set it to a desired goal. If possible, sit up straight or lean slightly forward. Try not to slouch. Hold the incentive spirometer in an upright position. INSTRUCTIONS FOR USE  Sit on the edge of your bed if possible, or sit up as far as you can in bed or on a chair. Hold the incentive spirometer in an upright position. Breathe out normally. Place the mouthpiece in your mouth and seal your lips tightly around it. Breathe in slowly and as deeply as possible, raising the piston or the ball toward the top of the column. Hold your breath for 3-5 seconds or for as long as possible. Allow the piston or ball to fall to the bottom of the column. Remove the mouthpiece from your mouth and breathe out normally. Rest for a few seconds and repeat Steps 1 through 7 at least 10 times every 1-2  hours when you are awake. Take your time and take a few normal breaths between deep breaths. The spirometer may include an indicator to show your best effort. Use the indicator as a goal to work toward during each repetition. After each set of 10 deep breaths, practice coughing to be sure your lungs are clear. If you have an incision (the cut made at the time of surgery), support your incision when coughing by placing a pillow or rolled up towels firmly against it. Once you are able to get out of bed, walk around indoors and cough well. You may stop using the incentive spirometer when instructed by your caregiver.  RISKS AND COMPLICATIONS Take your time so you do not get dizzy or light-headed. If you are in pain, you may need to take or ask for  pain medication before doing incentive spirometry. It is harder to take a deep breath if you are having pain. AFTER USE Rest and breathe slowly and easily. It can be helpful to keep track of a log of your progress. Your caregiver can provide you with a simple table to help with this. If you are using the spirometer at home, follow these instructions: SEEK MEDICAL CARE IF:  You are having difficultly using the spirometer. You have trouble using the spirometer as often as instructed. Your pain medication is not giving enough relief while using the spirometer. You develop fever of 100.5 F (38.1 C) or higher. SEEK IMMEDIATE MEDICAL CARE IF:  You cough up bloody sputum that had not been present before. You develop fever of 102 F (38.9 C) or greater. You develop worsening pain at or near the incision site. MAKE SURE YOU:  Understand these instructions. Will watch your condition. Will get help right away if you are not doing well or get worse. Document Released: 08/02/2006 Document Revised: 06/14/2011 Document Reviewed: 10/03/2006 [facility] Patient Information 2014 [place], [place].   ________________________________________________________________________

## 2022-09-14 ENCOUNTER — Encounter (HOSPITAL_COMMUNITY): Payer: Self-pay

## 2022-09-14 ENCOUNTER — Encounter (HOSPITAL_COMMUNITY)
Admission: RE | Admit: 2022-09-14 | Discharge: 2022-09-14 | Disposition: A | Discharge status: Home / Self Care | Payer: 59 | Source: Ambulatory Visit | Attending: Orthopedic Surgery | Admitting: Orthopedic Surgery

## 2022-09-14 ENCOUNTER — Other Ambulatory Visit: Payer: Self-pay

## 2022-09-14 VITALS — BP 119/50 | HR 83 | Temp 98.1°F | Ht 62.0 in | Wt 124.0 lb

## 2022-09-14 DIAGNOSIS — Z01818 Encounter for other preprocedural examination: Secondary | ICD-10-CM | POA: Insufficient documentation

## 2022-09-14 DIAGNOSIS — I351 Nonrheumatic aortic (valve) insufficiency: Secondary | ICD-10-CM | POA: Diagnosis not present

## 2022-09-14 DIAGNOSIS — M1612 Unilateral primary osteoarthritis, left hip: Secondary | ICD-10-CM | POA: Insufficient documentation

## 2022-09-14 DIAGNOSIS — J479 Bronchiectasis, uncomplicated: Secondary | ICD-10-CM | POA: Insufficient documentation

## 2022-09-14 DIAGNOSIS — R053 Chronic cough: Secondary | ICD-10-CM | POA: Insufficient documentation

## 2022-09-14 HISTORY — DX: Unspecified osteoarthritis, unspecified site: M19.90

## 2022-09-14 LAB — CBC
HCT: 32.7 % — ABNORMAL LOW (ref 36.0–46.0)
Hemoglobin: 10.1 g/dL — ABNORMAL LOW (ref 12.0–15.0)
MCH: 29 pg (ref 26.0–34.0)
MCHC: 30.9 g/dL (ref 30.0–36.0)
MCV: 94 fL (ref 80.0–100.0)
Platelets: 308 10*3/uL (ref 150–400)
RBC: 3.48 MIL/uL — ABNORMAL LOW (ref 3.87–5.11)
RDW: 18.7 % — ABNORMAL HIGH (ref 11.5–15.5)
WBC: 4.9 10*3/uL (ref 4.0–10.5)
nRBC: 0.4 % — ABNORMAL HIGH (ref 0.0–0.2)

## 2022-09-14 LAB — TYPE AND SCREEN: Antibody Screen: NEGATIVE

## 2022-09-14 LAB — SURGICAL PCR SCREEN
MRSA, PCR: NEGATIVE
Staphylococcus aureus: NEGATIVE

## 2022-09-14 NOTE — Progress Notes (Signed)
For Short Stay: COVID SWAB appointment date:  Bowel Prep reminder:   For Anesthesia: PCP - Dr. Karlene Einstein Cardiologist - N/A Pulmonologist: Ames Dura: NP. LOV: 08/05/22 Chest x-ray - 08/09/22 EKG - 09/14/22 Stress Test -  ECHO - 01/27/21 Cardiac Cath -  Pacemaker/ICD device last checked: Pacemaker orders received: Device Rep notified:  Spinal Cord Stimulator: N/A  Sleep Study - N/A CPAP -   Fasting Blood Sugar - N/A Checks Blood Sugar _____ times a day Date and result of last Hgb A1c-  Last dose of GLP1 agonist- N/A GLP1 instructions:   Last dose of SGLT-2 inhibitors- N/A SGLT-2 instructions:   Blood Thinner Instructions: N/A Aspirin Instructions: Last Dose:  Activity level: Can go up a flight of stairs and activities of daily living without stopping and without chest pain and/or shortness of breath   Able to exercise without chest pain and/or shortness of breath  Anesthesia review: Hx: Chronic cough,mild esophageal dysmotility,bronchiectasis.  Patient denies shortness of breath, fever, cough and chest pain at PAT appointment   Patient verbalized understanding of instructions that were given to them at the PAT appointment. Patient was also instructed that they will need to review over the PAT instructions again at home before surgery.

## 2022-09-15 NOTE — Progress Notes (Signed)
Anesthesia Chart Review   Case: [id number] Date/Time: 09/27/22 1216   Procedure: LEFT TOTAL HIP ARTHROPLASTY ANTERIOR APPROACH (Left: Hip)   Anesthesia type: Spinal   Pre-op diagnosis: LEFT HIP OSTEOARTHRITIS   Location: [facility] ROOM 07 / [facility]   Surgeons: Gean Birchwood, MD       DISCUSSION:89 y.o. never smoker with h/o left hip OA scheduled for above procedure 09/27/22 with Dr. Gean Birchwood.   Pt seen by pulmonology for preoperative evaluation.  Per OV note, "Patient followed by pulmonary office for history of bronchiectasis.  She has a chronic cough without purulent sputum.  No associated dyspnea or recent exacerbations.  Exam benign.  Oxygen saturation 99% room air.  She need preop chest x-ray and basic spirometry prior to hip surgery.  She will likely be moderate to high risk for prolonged mechanical ventilation and or postop pulmonary complications due to her age and underlying lung condition.  Ultimate clearance will be decided upon by surgeon and anesthesiology.   Major Pulmonary risks identified in the multifactorial risk analysis are but not limited to a) pneumonia; b) recurrent intubation risk; c) prolonged or recurrent acute respiratory failure needing mechanical ventilation; d) prolonged hospitalization; e) DVT/Pulmonary embolism; f) Acute Pulmonary edema   Recommend 1. Short duration of surgery as much as possible and avoid paralytic if possible 2. Recovery in step down or ICU with Pulmonary consultation if needed 3. DVT prophylaxis 4. Aggressive pulmonary toilet with o2, bronchodilatation, and incentive spirometry and early ambulation"   VS: BP (!) 119/50   Pulse 83   Temp 36.7 C (Oral)   Ht 5\' 2"  (1.575 m)   Wt 56.2 kg   SpO2 97%   BMI 22.68 kg/m   PROVIDERS: Lula Olszewski, MD is PCP    LABS: Labs reviewed: Acceptable for surgery. (all labs ordered are listed, but only abnormal results are displayed)  Labs Reviewed  CBC - Abnormal; Notable for the following  components:      Result Value   RBC 3.48 (*)    Hemoglobin 10.1 (*)    HCT 32.7 (*)    RDW 18.7 (*)    nRBC 0.4 (*)    All other components within normal limits  SURGICAL PCR SCREEN  TYPE AND SCREEN     IMAGES:   EKG:   CV: Echo 01/27/2021 1. Left ventricular ejection fraction by 3D volume is 62 %. The left  ventricle has normal function. The left ventricle has no regional wall  motion abnormalities. Left ventricular diastolic parameters are consistent  with Grade I diastolic dysfunction  (impaired relaxation).   2. Right ventricular systolic function is normal. The right ventricular  size is normal. There is normal pulmonary artery systolic pressure.   3. The mitral valve is normal in structure. No evidence of mitral valve  regurgitation.   4. The aortic valve is normal in structure. Aortic valve regurgitation is  mild to moderate. No aortic stenosis is present.   5. The inferior vena cava is normal in size with greater than 50%  respiratory variability, suggesting right atrial pressure of 3 mmHg.   Past Medical History:  Diagnosis Date   Arthritis    Second hand smoke exposure     Past Surgical History:  Procedure Laterality Date   CATARACT EXTRACTION Right 05/01/2013   CATARACT EXTRACTION Left 11/15/2013   ESOPHAGEAL MANOMETRY N/A 09/15/2020   Procedure: ESOPHAGEAL MANOMETRY (EM);  Surgeon: Shellia Cleverly, DO;  Location: [facility];  Service: Endoscopy;  Laterality: N/A;  PH IMPEDANCE STUDY  09/15/2020   Procedure: PH IMPEDANCE STUDY;  Surgeon: Shellia Cleverly, DO;  Location: [facility];  Service: Endoscopy;;   PH IMPEDANCE STUDY  04/15/2021   Procedure: PH IMPEDANCE STUDY;  Surgeon: Shellia Cleverly, DO;  Location: [facility];  Service: Gastroenterology;;   TONSILLECTOMY     age 34   TOTAL HIP ARTHROPLASTY Right 05/28/2013    MEDICATIONS:  acetaminophen (TYLENOL) 650 MG CR tablet   albuterol (PROVENTIL) (2.5 MG/3ML) 0.083% nebulizer solution    dexlansoprazole (DEXILANT) 60 MG capsule   guaiFENesin (MUCINEX) 600 MG 12 hr tablet   Multiple Vitamins-Minerals (CENTRUM SILVER 50+WOMEN PO)   No current facility-administered medications for this encounter.    Jodell Cipro Ward, PA-C [facility]-Surgical Testing [phone]

## 2022-09-15 NOTE — Anesthesia Preprocedure Evaluation (Addendum)
Anesthesia Evaluation  Patient identified by MRN, date of birth, ID band Patient awake    Reviewed: Allergy & Precautions, NPO status , Patient's Chart, lab work & pertinent test results  History of Anesthesia Complications Negative for: history of anesthetic complications  Airway Mallampati: II  TM Distance: >3 FB Neck ROM: Full    Dental  (+) Poor Dentition, Dental Advisory Given Missing several teeth. Denies loose teeth.:   Pulmonary neg shortness of breath, neg sleep apnea, neg COPD, neg recent URI Bronchiectasis, chronic cough   Pulmonary exam normal breath sounds clear to auscultation       Cardiovascular (-) hypertension(-) angina (-) Past MI, (-) Cardiac Stents and (-) CABG (-) dysrhythmias + Valvular Problems/Murmurs AI  Rhythm:Regular Rate:Normal  TTE 01/27/2021: IMPRESSIONS     1. Left ventricular ejection fraction by 3D volume is 62 %. The left  ventricle has normal function. The left ventricle has no regional wall  motion abnormalities. Left ventricular diastolic parameters are consistent  with Grade I diastolic dysfunction  (impaired relaxation).   2. Right ventricular systolic function is normal. The right ventricular  size is normal. There is normal pulmonary artery systolic pressure.   3. The mitral valve is normal in structure. No evidence of mitral valve  regurgitation.   4. The aortic valve is normal in structure. Aortic valve regurgitation is  mild to moderate. No aortic stenosis is present.   5. The inferior vena cava is normal in size with greater than 50%  respiratory variability, suggesting right atrial pressure of 3 mmHg.     Neuro/Psych negative neurological ROS     GI/Hepatic Neg liver ROS,GERD  Medicated,,  Endo/Other  negative endocrine ROS    Renal/GU negative Renal ROS     Musculoskeletal  (+) Arthritis , Osteoarthritis,    Abdominal   Peds  Hematology negative hematology  ROS (+)   Anesthesia Other Findings Platelets 308 on 09/14/2022  Reproductive/Obstetrics                             Anesthesia Physical Anesthesia Plan  ASA: 3  Anesthesia Plan: MAC and Spinal   Post-op Pain Management: Tylenol PO (pre-op)*   Induction: Intravenous  PONV Risk Score and Plan: 2 and Ondansetron, Dexamethasone, Propofol infusion and Treatment may vary due to age or medical condition  Airway Management Planned: Natural Airway and Simple Face Mask  Additional Equipment:   Intra-op Plan:   Post-operative Plan:   Informed Consent: I have reviewed the patients History and Physical, chart, labs and discussed the procedure including the risks, benefits and alternatives for the proposed anesthesia with the patient or authorized representative who has indicated his/her understanding and acceptance.     Dental advisory given  Plan Discussed with: CRNA and Anesthesiologist  Anesthesia Plan Comments: (See PAT note 09/14/22  I have discussed risks of neuraxial anesthesia including but not limited to infection, bleeding, nerve injury, back pain, headache, seizures, and failure of block. Patient denies bleeding disorders and is not currently anticoagulated. Labs have been reviewed. Risks and benefits discussed. All patient's questions answered.   Discussed with patient risks of MAC including, but not limited to, minor pain or discomfort, hearing people in the room, and possible need for backup general anesthesia. Risks for general anesthesia also discussed including, but not limited to, sore throat, hoarse voice, chipped/damaged teeth, injury to vocal cords, nausea and vomiting, allergic reactions, lung infection, heart attack, stroke, and death.  All questions answered. )       Anesthesia Quick Evaluation

## 2022-09-17 DIAGNOSIS — M1612 Unilateral primary osteoarthritis, left hip: Secondary | ICD-10-CM | POA: Diagnosis present

## 2022-09-17 NOTE — H&P (Signed)
TOTAL HIP ADMISSION H&P  Patient is admitted for left total hip arthroplasty.  Subjective:  Chief Complaint: left hip pain  HPI: Melinda Valencia, 89 y.o. female, has a history of pain and functional disability in the left hip(s) due to arthritis and patient has failed non-surgical conservative treatments for greater than 12 weeks to include NSAID's and/or analgesics, use of assistive devices, weight reduction as appropriate, and activity modification.  Onset of symptoms was gradual starting >10 years ago with gradually worsening course since that time.The patient noted no past surgery on the left hip(s).  Patient currently rates pain in the left hip at 10 out of 10 with activity. Patient has night pain, worsening of pain with activity and weight bearing, trendelenberg gait, pain that interfers with activities of daily living, and pain with passive range of motion. Patient has evidence of  protrusio  by imaging studies. This condition presents safety issues increasing the risk of falls.   There is no current active infection.  Patient Active Problem List   Diagnosis Date Noted   Arthritis of left hip 09/17/2022   Pre-operative respiratory examination 08/05/2022   Heartburn    Gastroesophageal reflux disease 01/13/2021   Dysphagia 04/09/2020   Cough 04/09/2020   Bronchiectasis without complication (HCC) 01/17/2020   Past Medical History:  Diagnosis Date   Arthritis    Second hand smoke exposure     Past Surgical History:  Procedure Laterality Date   CATARACT EXTRACTION Right 05/01/2013   CATARACT EXTRACTION Left 11/15/2013   ESOPHAGEAL MANOMETRY N/A 09/15/2020   Procedure: ESOPHAGEAL MANOMETRY (EM);  Surgeon: Shellia Cleverly, DO;  Location: [facility];  Service: Endoscopy;  Laterality: N/A;   PH IMPEDANCE STUDY  09/15/2020   Procedure: PH IMPEDANCE STUDY;  Surgeon: Shellia Cleverly, DO;  Location: [facility];  Service: Endoscopy;;   PH IMPEDANCE STUDY  04/15/2021   Procedure: PH  IMPEDANCE STUDY;  Surgeon: Shellia Cleverly, DO;  Location: [facility];  Service: Gastroenterology;;   TONSILLECTOMY     age 15   TOTAL HIP ARTHROPLASTY Right 05/28/2013    No current facility-administered medications for this encounter.   Current Outpatient Medications  Medication Sig Dispense Refill Last Dose   acetaminophen (TYLENOL) 650 MG CR tablet Take 650 mg by mouth every 8 (eight) hours as needed for pain.   Past Week   Multiple Vitamins-Minerals (CENTRUM SILVER 50+WOMEN PO) Take 1 capsule by mouth daily.   Past Week   albuterol (PROVENTIL) (2.5 MG/3ML) 0.083% nebulizer solution Take 3 mLs (2.5 mg total) by nebulization every 6 (six) hours as needed for wheezing or shortness of breath. (Patient not taking: Reported on 09/14/2022) 75 mL 12 Not Taking   dexlansoprazole (DEXILANT) 60 MG capsule Take 1 capsule (60 mg total) by mouth daily. (Patient not taking: Reported on 05/15/2021) 60 capsule 5 Not Taking   guaiFENesin (MUCINEX) 600 MG 12 hr tablet Take 1 tablet (600 mg total) by mouth 2 (two) times daily. (Patient not taking: Reported on 09/14/2022) 60 tablet 2 Not Taking   No Known Allergies  Social History   Tobacco Use   Smoking status: Never    Passive exposure: Yes   Smokeless tobacco: Never  Substance Use Topics   Alcohol use: Not Currently    Comment: social 10-15 years ago 04/09/2020    Family History  Problem Relation Age of Onset   Breast cancer Mother    Diabetes Father      Review of Systems  Constitutional: Negative.  HENT: Negative.    Eyes: Negative.   Respiratory: Negative.    Cardiovascular: Negative.   Gastrointestinal: Negative.   Endocrine: Negative.   Genitourinary: Negative.   Musculoskeletal:  Positive for arthralgias.  Skin: Negative.   Allergic/Immunologic: Negative.   Neurological: Negative.   Hematological: Negative.   Psychiatric/Behavioral: Negative.      Objective:  Physical Exam Constitutional:      Appearance: Normal  appearance. She is normal weight.  HENT:     Head: Normocephalic and atraumatic.     Nose: Nose normal.     Mouth/Throat:     Mouth: Mucous membranes are moist.     Pharynx: Oropharynx is clear.  Eyes:     Pupils: Pupils are equal, round, and reactive to light.  Cardiovascular:     Pulses: Normal pulses.  Pulmonary:     Effort: Pulmonary effort is normal.  Musculoskeletal:     Cervical back: Normal range of motion and neck supple.     Comments: She is slender and frail.  Today, the patient's right hip has good strength good range of motion.  No pain with internal and external rotation.  Patient's left hip does have significant irritability with attempts of motion.  She has no internal rotation.  Pain with palpation of the left groin.  Calves are soft and nontender.  Skin:    General: Skin is warm and dry.  Neurological:     General: No focal deficit present.     Mental Status: She is alert and oriented to person, place, and time. Mental status is at baseline.  Psychiatric:        Mood and Affect: Mood normal.        Behavior: Behavior normal.        Thought Content: Thought content normal.        Judgment: Judgment normal.     Vital signs in last 24 hours:    Labs:   Estimated body mass index is 22.68 kg/m as calculated from the following:   Height as of 09/14/22: 5\' 2"  (1.575 m).   Weight as of 09/14/22: 56.2 kg.   Imaging Review Plain radiographs demonstrate  AP of the pelvis and crosstable lateral of the left hip are taken and reviewed in office today.  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   Assessment/Plan:  End stage arthritis, left hip(s)  The patient history, physical examination, clinical judgement of the provider and imaging studies are consistent with end stage degenerative joint disease of the left hip(s) and total hip arthroplasty is deemed medically necessary. The treatment options including medical management, injection  therapy, arthroscopy and arthroplasty were discussed at length. The risks and benefits of total hip arthroplasty were presented and reviewed. The risks due to aseptic loosening, infection, stiffness, dislocation/subluxation,  thromboembolic complications and other imponderables were discussed.  The patient acknowledged the explanation, agreed to proceed with the plan and consent was signed. Patient is being admitted for inpatient treatment for surgery, pain control, PT, OT, prophylactic antibiotics, VTE prophylaxis, progressive ambulation and ADL's and discharge planning.The patient is planning to be discharged home with home health services    Patient's anticipated LOS is less than 2 midnights, meeting these requirements: - Younger than 2 - Lives within 1 hour of care - Has a competent adult at home to recover with post-op recover - NO history of  - Chronic pain requiring opiods  - Diabetes  - Coronary Artery Disease  - Heart failure  - Heart  attack  - Stroke  - DVT/VTE  - Cardiac arrhythmia  - Respiratory Failure/COPD  - Renal failure  - Anemia  - Advanced Liver disease

## 2022-09-27 ENCOUNTER — Ambulatory Visit (HOSPITAL_BASED_OUTPATIENT_CLINIC_OR_DEPARTMENT_OTHER): Payer: 59 | Admitting: Anesthesiology

## 2022-09-27 ENCOUNTER — Ambulatory Visit (HOSPITAL_COMMUNITY): Payer: 59

## 2022-09-27 ENCOUNTER — Other Ambulatory Visit: Payer: Self-pay

## 2022-09-27 ENCOUNTER — Encounter (HOSPITAL_COMMUNITY)
Admission: RE | Disposition: A | Discharge status: Skilled Nursing Facility | Payer: Self-pay | Source: Home / Self Care | Attending: Orthopedic Surgery

## 2022-09-27 ENCOUNTER — Ambulatory Visit (HOSPITAL_COMMUNITY): Payer: 59 | Admitting: Physician Assistant

## 2022-09-27 ENCOUNTER — Inpatient Hospital Stay (HOSPITAL_COMMUNITY)
Admission: RE | Admit: 2022-09-27 | Discharge: 2022-09-30 | DRG: 470 | Disposition: A | Discharge status: Skilled Nursing Facility | Payer: 59 | Attending: Orthopedic Surgery | Admitting: Orthopedic Surgery

## 2022-09-27 ENCOUNTER — Encounter (HOSPITAL_COMMUNITY): Payer: Self-pay | Admitting: Orthopedic Surgery

## 2022-09-27 DIAGNOSIS — M1612 Unilateral primary osteoarthritis, left hip: Secondary | ICD-10-CM | POA: Diagnosis not present

## 2022-09-27 DIAGNOSIS — J479 Bronchiectasis, uncomplicated: Secondary | ICD-10-CM | POA: Diagnosis not present

## 2022-09-27 DIAGNOSIS — I351 Nonrheumatic aortic (valve) insufficiency: Secondary | ICD-10-CM | POA: Diagnosis not present

## 2022-09-27 DIAGNOSIS — Z7722 Contact with and (suspected) exposure to environmental tobacco smoke (acute) (chronic): Secondary | ICD-10-CM | POA: Diagnosis present

## 2022-09-27 DIAGNOSIS — Z96641 Presence of right artificial hip joint: Secondary | ICD-10-CM | POA: Diagnosis present

## 2022-09-27 DIAGNOSIS — J42 Unspecified chronic bronchitis: Principal | ICD-10-CM

## 2022-09-27 DIAGNOSIS — Z833 Family history of diabetes mellitus: Secondary | ICD-10-CM

## 2022-09-27 DIAGNOSIS — R54 Age-related physical debility: Secondary | ICD-10-CM | POA: Diagnosis present

## 2022-09-27 DIAGNOSIS — K219 Gastro-esophageal reflux disease without esophagitis: Secondary | ICD-10-CM | POA: Diagnosis present

## 2022-09-27 DIAGNOSIS — Z96642 Presence of left artificial hip joint: Secondary | ICD-10-CM

## 2022-09-27 DIAGNOSIS — Z79899 Other long term (current) drug therapy: Secondary | ICD-10-CM

## 2022-09-27 DIAGNOSIS — D62 Acute posthemorrhagic anemia: Secondary | ICD-10-CM | POA: Diagnosis not present

## 2022-09-27 DIAGNOSIS — Z803 Family history of malignant neoplasm of breast: Secondary | ICD-10-CM

## 2022-09-27 DIAGNOSIS — Z96649 Presence of unspecified artificial hip joint: Secondary | ICD-10-CM

## 2022-09-27 HISTORY — PX: TOTAL HIP ARTHROPLASTY: SHX124

## 2022-09-27 LAB — ABO/RH: ABO/RH(D): A POS

## 2022-09-27 SURGERY — ARTHROPLASTY, HIP, TOTAL, ANTERIOR APPROACH
Anesthesia: Monitor Anesthesia Care | Site: Hip | Laterality: Left

## 2022-09-27 MED ORDER — PROPOFOL 10 MG/ML IV BOLUS
INTRAVENOUS | Status: DC | PRN
  Administered 2022-09-27: 20 mg via INTRAVENOUS

## 2022-09-27 MED ORDER — SODIUM CHLORIDE (PF) 0.9 % IJ SOLN
INTRAMUSCULAR | Status: AC
  Filled 2022-09-27: qty 50

## 2022-09-27 MED ORDER — 0.9 % SODIUM CHLORIDE (POUR BTL) OPTIME
TOPICAL | Status: DC | PRN
  Administered 2022-09-27: 1000 mL

## 2022-09-27 MED ORDER — CEFAZOLIN SODIUM-DEXTROSE 2-4 GM/100ML-% IV SOLN
2.0000 g | INTRAVENOUS | Status: AC
  Administered 2022-09-27: 2 g via INTRAVENOUS
  Filled 2022-09-27: qty 100

## 2022-09-27 MED ORDER — BISACODYL 5 MG PO TBEC: 5.0000 mg | DELAYED_RELEASE_TABLET | Freq: Every day | ORAL | Status: DC | PRN

## 2022-09-27 MED ORDER — MENTHOL 3 MG MT LOZG: 1.0000 | LOZENGE | OROMUCOSAL | Status: DC | PRN

## 2022-09-27 MED ORDER — HYDROMORPHONE HCL 1 MG/ML IJ SOLN
0.5000 mg | INTRAMUSCULAR | Status: DC | PRN
  Administered 2022-09-27: 0.5 mg via INTRAVENOUS

## 2022-09-27 MED ORDER — METOCLOPRAMIDE HCL 5 MG/ML IJ SOLN: 5.0000 mg | Freq: Three times a day (TID) | INTRAMUSCULAR | Status: DC | PRN

## 2022-09-27 MED ORDER — METHOCARBAMOL 500 MG IVPB - SIMPLE MED
INTRAVENOUS | Status: AC
  Filled 2022-09-27: qty 55

## 2022-09-27 MED ORDER — PNEUMOCOCCAL 20-VAL CONJ VACC 0.5 ML IM SUSY
0.5000 mL | PREFILLED_SYRINGE | INTRAMUSCULAR | Status: DC
  Filled 2022-09-27: qty 0.5

## 2022-09-27 MED ORDER — DEXAMETHASONE SODIUM PHOSPHATE 10 MG/ML IJ SOLN
INTRAMUSCULAR | Status: DC | PRN
  Administered 2022-09-27: 10 mg via INTRAVENOUS

## 2022-09-27 MED ORDER — DEXAMETHASONE SODIUM PHOSPHATE 10 MG/ML IJ SOLN
INTRAMUSCULAR | Status: AC
  Filled 2022-09-27: qty 1

## 2022-09-27 MED ORDER — BUPIVACAINE-EPINEPHRINE 0.25% -1:200000 IJ SOLN
INTRAMUSCULAR | Status: DC | PRN
  Administered 2022-09-27: 30 mL

## 2022-09-27 MED ORDER — ONDANSETRON HCL 4 MG/2ML IJ SOLN: 4.0000 mg | Freq: Four times a day (QID) | INTRAMUSCULAR | Status: DC | PRN

## 2022-09-27 MED ORDER — CHLORHEXIDINE GLUCONATE 0.12 % MT SOLN
15.0000 mL | Freq: Once | OROMUCOSAL | Status: AC
  Administered 2022-09-27: 15 mL via OROMUCOSAL

## 2022-09-27 MED ORDER — FLEET ENEMA 7-19 GM/118ML RE ENEM: 1.0000 | ENEMA | Freq: Once | RECTAL | Status: DC | PRN

## 2022-09-27 MED ORDER — LACTATED RINGERS IV SOLN: INTRAVENOUS | Status: DC

## 2022-09-27 MED ORDER — BUPIVACAINE LIPOSOME 1.3 % IJ SUSP
INTRAMUSCULAR | Status: AC
  Filled 2022-09-27: qty 10

## 2022-09-27 MED ORDER — ALBUTEROL SULFATE (2.5 MG/3ML) 0.083% IN NEBU: 2.5000 mg | INHALATION_SOLUTION | Freq: Four times a day (QID) | RESPIRATORY_TRACT | Status: DC | PRN

## 2022-09-27 MED ORDER — BUPIVACAINE-EPINEPHRINE 0.25% -1:200000 IJ SOLN
INTRAMUSCULAR | Status: AC
  Filled 2022-09-27: qty 1

## 2022-09-27 MED ORDER — CENTRUM SILVER 50+WOMEN PO TABS: ORAL_TABLET | Freq: Every day | ORAL | Status: DC

## 2022-09-27 MED ORDER — METHOCARBAMOL 500 MG IVPB - SIMPLE MED
500.0000 mg | Freq: Four times a day (QID) | INTRAVENOUS | Status: DC | PRN
  Administered 2022-09-27: 500 mg via INTRAVENOUS

## 2022-09-27 MED ORDER — PROPOFOL 500 MG/50ML IV EMUL
INTRAVENOUS | Status: DC | PRN
  Administered 2022-09-27: 125 ug/kg/min via INTRAVENOUS

## 2022-09-27 MED ORDER — KCL IN DEXTROSE-NACL 20-5-0.45 MEQ/L-%-% IV SOLN
INTRAVENOUS | Status: DC
  Filled 2022-09-27 (×5): qty 1000

## 2022-09-27 MED ORDER — ASPIRIN 81 MG PO CHEW
81.0000 mg | CHEWABLE_TABLET | Freq: Two times a day (BID) | ORAL | Status: DC
  Administered 2022-09-27 – 2022-09-30 (×6): 81 mg via ORAL
  Filled 2022-09-27 (×7): qty 1

## 2022-09-27 MED ORDER — TRANEXAMIC ACID 1000 MG/10ML IV SOLN
INTRAVENOUS | Status: DC | PRN
  Administered 2022-09-27: 2000 mg via TOPICAL

## 2022-09-27 MED ORDER — METHOCARBAMOL 500 MG PO TABS
500.0000 mg | ORAL_TABLET | Freq: Four times a day (QID) | ORAL | Status: DC | PRN
  Filled 2022-09-27: qty 1

## 2022-09-27 MED ORDER — DIPHENHYDRAMINE HCL 12.5 MG/5ML PO ELIX: 12.5000 mg | ORAL_SOLUTION | ORAL | Status: DC | PRN

## 2022-09-27 MED ORDER — POLYETHYLENE GLYCOL 3350 17 G PO PACK: 17.0000 g | PACK | Freq: Every day | ORAL | Status: DC | PRN

## 2022-09-27 MED ORDER — PANTOPRAZOLE SODIUM 40 MG PO TBEC
40.0000 mg | DELAYED_RELEASE_TABLET | Freq: Every day | ORAL | Status: DC
  Administered 2022-09-27 – 2022-09-30 (×4): 40 mg via ORAL
  Filled 2022-09-27 (×4): qty 1

## 2022-09-27 MED ORDER — DEXAMETHASONE SODIUM PHOSPHATE 10 MG/ML IJ SOLN
10.0000 mg | Freq: Once | INTRAMUSCULAR | Status: AC
  Administered 2022-09-28: 10 mg via INTRAVENOUS
  Filled 2022-09-27: qty 1

## 2022-09-27 MED ORDER — OXYCODONE HCL 5 MG PO TABS: 5.0000 mg | ORAL_TABLET | Freq: Once | ORAL | Status: DC | PRN

## 2022-09-27 MED ORDER — AMISULPRIDE (ANTIEMETIC) 5 MG/2ML IV SOLN: 10.0000 mg | Freq: Once | INTRAVENOUS | Status: DC | PRN

## 2022-09-27 MED ORDER — CEFAZOLIN SODIUM-DEXTROSE 1-4 GM/50ML-% IV SOLN
1.0000 g | Freq: Four times a day (QID) | INTRAVENOUS | Status: AC
  Administered 2022-09-27 (×2): 1 g via INTRAVENOUS
  Filled 2022-09-27 (×2): qty 50

## 2022-09-27 MED ORDER — BUPIVACAINE IN DEXTROSE 0.75-8.25 % IT SOLN
INTRATHECAL | Status: DC | PRN
  Administered 2022-09-27: 1.6 mL via INTRATHECAL

## 2022-09-27 MED ORDER — TRANEXAMIC ACID 1000 MG/10ML IV SOLN
2000.0000 mg | INTRAVENOUS | Status: DC
  Filled 2022-09-27: qty 20

## 2022-09-27 MED ORDER — TRANEXAMIC ACID-NACL 1000-0.7 MG/100ML-% IV SOLN
1000.0000 mg | INTRAVENOUS | Status: DC
  Filled 2022-09-27: qty 100

## 2022-09-27 MED ORDER — PHENYLEPHRINE 80 MCG/ML (10ML) SYRINGE FOR IV PUSH (FOR BLOOD PRESSURE SUPPORT)
PREFILLED_SYRINGE | INTRAVENOUS | Status: DC | PRN
  Administered 2022-09-27 (×2): 160 ug via INTRAVENOUS
  Administered 2022-09-27 (×4): 80 ug via INTRAVENOUS
  Administered 2022-09-27: 160 ug via INTRAVENOUS
  Administered 2022-09-27: 80 ug via INTRAVENOUS

## 2022-09-27 MED ORDER — POVIDONE-IODINE 10 % EX SWAB
2.0000 | Freq: Once | CUTANEOUS | Status: AC
  Administered 2022-09-27: 2 via TOPICAL

## 2022-09-27 MED ORDER — ORAL CARE MOUTH RINSE: 15.0000 mL | Freq: Once | OROMUCOSAL | Status: AC

## 2022-09-27 MED ORDER — BUPIVACAINE LIPOSOME 1.3 % IJ SUSP: 10.0000 mL | Freq: Once | INTRAMUSCULAR | Status: DC

## 2022-09-27 MED ORDER — ONDANSETRON HCL 4 MG PO TABS: 4.0000 mg | ORAL_TABLET | Freq: Four times a day (QID) | ORAL | Status: DC | PRN

## 2022-09-27 MED ORDER — OXYCODONE HCL 5 MG PO TABS
ORAL_TABLET | ORAL | Status: AC
  Filled 2022-09-27: qty 1

## 2022-09-27 MED ORDER — EPHEDRINE SULFATE-NACL 50-0.9 MG/10ML-% IV SOSY
PREFILLED_SYRINGE | INTRAVENOUS | Status: DC | PRN
  Administered 2022-09-27: 5 mg via INTRAVENOUS
  Administered 2022-09-27: 2.5 mg via INTRAVENOUS
  Administered 2022-09-27: 5 mg via INTRAVENOUS
  Administered 2022-09-27: 2.5 mg via INTRAVENOUS
  Administered 2022-09-27: 5 mg via INTRAVENOUS

## 2022-09-27 MED ORDER — FENTANYL CITRATE PF 50 MCG/ML IJ SOSY: 25.0000 ug | PREFILLED_SYRINGE | INTRAMUSCULAR | Status: DC | PRN

## 2022-09-27 MED ORDER — HYDROMORPHONE HCL 1 MG/ML IJ SOLN
INTRAMUSCULAR | Status: AC
  Filled 2022-09-27: qty 1

## 2022-09-27 MED ORDER — OXYCODONE HCL 5 MG PO TABS
5.0000 mg | ORAL_TABLET | ORAL | Status: DC | PRN
  Administered 2022-09-27: 10 mg via ORAL
  Administered 2022-09-27 – 2022-09-29 (×6): 5 mg via ORAL
  Administered 2022-09-30: 10 mg via ORAL
  Filled 2022-09-27: qty 1
  Filled 2022-09-27: qty 2
  Filled 2022-09-27 (×6): qty 1

## 2022-09-27 MED ORDER — DOCUSATE SODIUM 100 MG PO CAPS
100.0000 mg | ORAL_CAPSULE | Freq: Two times a day (BID) | ORAL | Status: DC
  Administered 2022-09-27 – 2022-09-30 (×6): 100 mg via ORAL
  Filled 2022-09-27 (×7): qty 1

## 2022-09-27 MED ORDER — ACETAMINOPHEN 500 MG PO TABS
1000.0000 mg | ORAL_TABLET | Freq: Once | ORAL | Status: AC
  Administered 2022-09-27: 1000 mg via ORAL
  Filled 2022-09-27: qty 2

## 2022-09-27 MED ORDER — PHENOL 1.4 % MT LIQD: 1.0000 | OROMUCOSAL | Status: DC | PRN

## 2022-09-27 MED ORDER — BUPIVACAINE LIPOSOME 1.3 % IJ SUSP
INTRAMUSCULAR | Status: DC | PRN
  Administered 2022-09-27: 10 mL

## 2022-09-27 MED ORDER — TRANEXAMIC ACID-NACL 1000-0.7 MG/100ML-% IV SOLN
1000.0000 mg | Freq: Once | INTRAVENOUS | Status: AC
  Administered 2022-09-27: 1000 mg via INTRAVENOUS
  Filled 2022-09-27: qty 100

## 2022-09-27 MED ORDER — PROPOFOL 1000 MG/100ML IV EMUL
INTRAVENOUS | Status: AC
  Filled 2022-09-27: qty 100

## 2022-09-27 MED ORDER — ONDANSETRON HCL 4 MG/2ML IJ SOLN
INTRAMUSCULAR | Status: AC
  Filled 2022-09-27: qty 2

## 2022-09-27 MED ORDER — OXYCODONE HCL 5 MG/5ML PO SOLN: 5.0000 mg | Freq: Once | ORAL | Status: DC | PRN

## 2022-09-27 MED ORDER — SODIUM CHLORIDE (PF) 0.9 % IJ SOLN
INTRAMUSCULAR | Status: DC | PRN
  Administered 2022-09-27: 50 mL

## 2022-09-27 MED ORDER — METOCLOPRAMIDE HCL 5 MG PO TABS: 5.0000 mg | ORAL_TABLET | Freq: Three times a day (TID) | ORAL | Status: DC | PRN

## 2022-09-27 MED ORDER — ALBUMIN HUMAN 5 % IV SOLN: INTRAVENOUS | Status: DC | PRN

## 2022-09-27 MED ORDER — ACETAMINOPHEN 325 MG PO TABS
325.0000 mg | ORAL_TABLET | Freq: Four times a day (QID) | ORAL | Status: DC | PRN
  Administered 2022-09-27 – 2022-09-29 (×2): 650 mg via ORAL
  Filled 2022-09-27 (×2): qty 2

## 2022-09-27 SURGICAL SUPPLY — 42 items
ACETAB CUP W/GRIPTION 54 (Plate) ×1 IMPLANT
BAG COUNTER SPONGE SURGICOUNT (BAG) ×1 IMPLANT
BAG DECANTER FOR FLEXI CONT (MISCELLANEOUS) ×2 IMPLANT
BAG SPNG CNTER NS LX DISP (BAG) ×1
BLADE SAW SGTL 18X1.27X75 (BLADE) ×1 IMPLANT
COVER PERINEAL POST (MISCELLANEOUS) ×1 IMPLANT
COVER SURGICAL LIGHT HANDLE (MISCELLANEOUS) ×1 IMPLANT
CUP ACETAB W/GRIPTION 54 (Plate) IMPLANT
DRAPE STERI IOBAN 125X83 (DRAPES) ×1 IMPLANT
DRAPE U-SHAPE 47X51 STRL (DRAPES) ×2 IMPLANT
DRSG AQUACEL AG ADV 3.5X10 (GAUZE/BANDAGES/DRESSINGS) ×1 IMPLANT
DURAPREP 26ML APPLICATOR (WOUND CARE) ×1 IMPLANT
ELECT BLADE TIP CTD 4 INCH (ELECTRODE) ×1 IMPLANT
ELECT REM PT RETURN 15FT ADLT (MISCELLANEOUS) ×1 IMPLANT
GLOVE BIO SURGEON STRL SZ7.5 (GLOVE) ×1 IMPLANT
GLOVE BIO SURGEON STRL SZ8.5 (GLOVE) ×1 IMPLANT
GLOVE BIOGEL PI IND STRL 8 (GLOVE) ×1 IMPLANT
GLOVE BIOGEL PI IND STRL 9 (GLOVE) ×1 IMPLANT
GOWN STRL REUS W/ TWL XL LVL3 (GOWN DISPOSABLE) ×2 IMPLANT
GOWN STRL REUS W/TWL XL LVL3 (GOWN DISPOSABLE) ×2
HEAD M SROM 36MM PLUS 1.5 (Hips) IMPLANT
HOLDER FOLEY CATH W/STRAP (MISCELLANEOUS) ×1 IMPLANT
KIT TURNOVER KIT A (KITS) IMPLANT
LINER NEUTRAL 54X36MM PLUS 4 (Hips) IMPLANT
MANIFOLD NEPTUNE II (INSTRUMENTS) ×1 IMPLANT
NDL HYPO 21X1.5 SAFETY (NEEDLE) ×2 IMPLANT
NEEDLE HYPO 21X1.5 SAFETY (NEEDLE) ×2 IMPLANT
NS IRRIG 1000ML POUR BTL (IV SOLUTION) ×1 IMPLANT
PACK ANTERIOR HIP CUSTOM (KITS) ×1 IMPLANT
SPIKE FLUID TRANSFER (MISCELLANEOUS) ×1 IMPLANT
SROM M HEAD 36MM PLUS 1.5 (Hips) ×1 IMPLANT
STEM FEMORAL SZ 6MM STD ACTIS (Stem) IMPLANT
SUT VIC AB 0 CT1 27 (SUTURE) ×1
SUT VIC AB 0 CT1 27XBRD ANBCTR (SUTURE) ×1 IMPLANT
SUT VIC AB 1 CTX 36 (SUTURE) ×1
SUT VIC AB 1 CTX36XBRD ANBCTR (SUTURE) ×1 IMPLANT
SUT VIC AB 2-0 CT1 27 (SUTURE) ×1
SUT VIC AB 2-0 CT1 TAPERPNT 27 (SUTURE) ×1 IMPLANT
SUT VICRYL+ 3-0 36IN CT-1 (SUTURE) ×1 IMPLANT
SYR CONTROL 10ML LL (SYRINGE) ×3 IMPLANT
TRAY FOLEY MTR SLVR 16FR STAT (SET/KITS/TRAYS/PACK) IMPLANT
TUBE SUCTION HIGH CAP CLEAR NV (SUCTIONS) ×1 IMPLANT

## 2022-09-27 NOTE — Transfer of Care (Signed)
Immediate Anesthesia Transfer of Care Note  Patient: Melinda Valencia  Procedure(s) Performed: LEFT TOTAL HIP ARTHROPLASTY ANTERIOR APPROACH (Left: Hip)  Patient Location: PACU  Anesthesia Type:Regional and Spinal  Level of Consciousness: awake and alert   Airway & Oxygen Therapy: Patient Spontanous Breathing and Patient connected to face mask oxygen  Post-op Assessment: Report given to RN and Post -op Vital signs reviewed and stable  Post vital signs: Reviewed and stable  Last Vitals:  Vitals Value Taken Time  BP 105/50 09/27/22 1342  Temp    Pulse 71 09/27/22 1343  Resp 16 09/27/22 1344  SpO2 100 % 09/27/22 1343  Vitals shown include unvalidated device data.  Last Pain:  Vitals:   09/27/22 0947  TempSrc:   PainSc: 6       Patients Stated Pain Goal: 2 (09/27/22 0947)  Complications: No notable events documented.

## 2022-09-27 NOTE — Interval H&P Note (Signed)
History and Physical Interval Note:  09/27/2022 11:10 AM  Melinda Valencia  has presented today for surgery, with the diagnosis of LEFT HIP OSTEOARTHRITIS.  The various methods of treatment have been discussed with the patient and family. After consideration of risks, benefits and other options for treatment, the patient has consented to  Procedure(s): LEFT TOTAL HIP ARTHROPLASTY ANTERIOR APPROACH (Left) as a surgical intervention.  The patient's history has been reviewed, patient examined, no change in status, stable for surgery.  I have reviewed the patient's chart and labs.  Questions were answered to the patient's satisfaction.     Nestor Lewandowsky

## 2022-09-27 NOTE — Op Note (Addendum)
PATIENT ID:      Melinda Valencia  MRN:     [id number] DOB/AGE:    87-Jan-1934 / 87 y.o.  OPERATIVE REPORT   DATE OF PROCEDURE:  09/27/2022      PREOPERATIVE DIAGNOSIS:  LEFT HIP OSTEOARTHRITIS                                                         POSTOPERATIVE DIAGNOSIS:  Same                                                         PROCEDURE: Anterior L total hip arthroplasty using a 54 mm DePuy Pinnacle  Cup, [employer], 0-degree polyethylene liner, a +1,5 mm x 36mm metal head, a 6 std Depuy Actis stem  SURGEON: Nestor Lewandowsky  ASSISTANT:   Tomi Likens. [employer]  (present throughout entire procedure and necessary for timely completion of the procedure)   ANESTHESIA: Spinal, Exparel 133mg  injection BLOOD LOSS: 550 cc FLUID REPLACEMENT: 1600 cc crystalloid TRANEXAMIC ACID: 1gm IV, 2gm Topical COMPLICATIONS: none    INDICATIONS FOR PROCEDURE: A 89 y.o. year-old With  LEFT HIP OSTEOARTHRITIS   for 5 years, x-rays show bone-on-bone arthritic changes, and osteophytes. Despite conservative measures with observation, anti-inflammatory medicine, narcotics, use of a cane, has severe unremitting pain and can ambulate only a few blocks before resting. Patient desires elective left total hip arthroplasty to decrease pain and increase function. The risks, benefits, and alternatives were discussed at length including but not limited to the risks of infection, bleeding, nerve injury, stiffness, blood clots, the need for revision surgery, cardiopulmonary complications, among others, and they were willing to proceed. Questions answered      PROCEDURE IN DETAIL: The patient was identified by armband, received preoperative IV antibiotics, in the holding area at [name], taken to the operating room , appropriate anesthetic monitors were attached and anesthesia was induced with the patient on the gurney. HANA boots were applied to the feet, and the patient  was transferred to the HANA table  with a peroneal post and support underneath the non-operative leg. The operative lower extremity was then prepped and draped in the usual sterile fashion from just above the iliac crest to the knee. And a timeout procedure was performed. Tomi Likens. [facility] was present and scrubbed throughout the case, critical for assistance with, positioning, exposure, retraction, instrumentation, and closure.Skin along incision area was injected with 10 cc of Exparel solution. We then made a 14 cm incision along the interval at the leading edge of the tensor fascia lata of starting at 2 cm lateral to the ASIS. Small bleeders in the skin and subcutaneous tissue identified and cauterized we dissected down to the fascia and made an incision in the fascia allowing [place] to elevate the fascia of the tensor muscle and exploited the interval between the rectus and the tensor fascia lata. A Cobra retractor was then placed along the superior neck of the femur. A cerebellar retractor was used to expose the interval between the tensor fascia lata and the rectus femoris.  We identified and cauterized the ascending  branch of the anterior circumflex artery. A second Cobra retractor along the inferior neck of the femur. A small Hohmann retractor was placed underneath the origin of the rectus femoris, giving [place] good medial exposure. Using Ronguers fatty tissue was removed from in front of the anterior capsule. The capsule was then incised, starting out at the superior anterior rim of the acetabulum going laterally along the anterior neck. The capsule was then teed along the neck superiorly and inferiorly. Electrocautery was used to release capsule from the anterior and medial neck of the femur to allow external rotation. The cobra retractors were then placed along the inferior and superior neck. The hip was externally rotated to 40 degrees, traction applied and locked. We perform a standard neck cut with the oscillating saw and removed the femoral  head with a power corkscrew. We then placed a medium curved medium homan retractor in the cotyloid notch and standard cobra retractor posteriorly along the acetabular rim. Exposed labral tissue and osteophytes were then removed. We then sequentially reamed up to a 53 mm basket reamer obtaining good coverage in all quadrants, verified by C-arm imaging. Under C-arm control we then hammered into place a 54 mm Pinnacle cup in 45 of abduction and 15 of anteversion. The cup seated nicely and required no supplemental screws. We then placed a central hole Eliminator and a +4 mm, 0 polyethylene liner. The foot was then externally rotated to 130-140. The limb was extended and adducted to the floor, delivering the proximal femur up into the wound. A medium curved Hohmann retractor was placed over the greater trochanter and a long Homan retractor along the posterior femoral neck completing the exposure and lateralizing the femur. We then performed releases superiorly and and inferiorly of the capsule going back to the pirformis fossa superiorly and to the lesser trochanter inferiorly. We then entered the proximal femur with the box cutting offset chisel followed by, a canal sounder, the chili pepper and broaching up to a 6 broach. This seated nicely and we reamed the calcar. A trial reduction was performed with a 1 mm X 36 mm head.The limb lengths were checked by c-arm, and the hip was stable in 90 of external rotation. At this point the trial components removed and we hammered into place a std  Offset # 6Actis stem with coating. A + 1 mm x 36 head was then hammered into place. The hip was reduced and final C-arm images obtained. The wound was thoroughly irrigated with normal saline solution. We repaired the ant capsule and the tensor fascia lot a with running 0 vicryl suture. the subcutaneous and subcuticular layers were closed with running 3-0 Vicryl suture followed by an Aquacil dressing. At this point the patient was  awaken and transferred to hospital gurney without difficulty.   Nestor Lewandowsky 09/27/2022, 11:11 AM

## 2022-09-27 NOTE — Anesthesia Procedure Notes (Signed)
Spinal  Patient location during procedure: OR End time: 09/27/2022 12:03 PM Reason for block: surgical anesthesia Staffing Performed: resident/CRNA  Resident/CRNA: Atilano Median, CRNA Performed by: Brenson Hartman, Clinical cytogeneticist D, CRNA Authorized by: Linton Rump, MD   Preanesthetic Checklist Completed: patient identified, IV checked, site marked, risks and benefits discussed, surgical consent, monitors and equipment checked, pre-op evaluation and timeout performed Spinal Block Patient position: sitting Prep: DuraPrep Patient monitoring: heart rate, continuous pulse ox and blood pressure Approach: midline Location: L3-4 Injection technique: single-shot Needle Needle type: Pencan  Needle gauge: 24 G Needle length: 9 cm Assessment Sensory level: T6 Events: CSF return

## 2022-09-28 ENCOUNTER — Encounter (HOSPITAL_COMMUNITY): Payer: Self-pay | Admitting: Orthopedic Surgery

## 2022-09-28 DIAGNOSIS — Z803 Family history of malignant neoplasm of breast: Secondary | ICD-10-CM | POA: Diagnosis not present

## 2022-09-28 DIAGNOSIS — D62 Acute posthemorrhagic anemia: Secondary | ICD-10-CM | POA: Diagnosis not present

## 2022-09-28 DIAGNOSIS — Z833 Family history of diabetes mellitus: Secondary | ICD-10-CM | POA: Diagnosis not present

## 2022-09-28 DIAGNOSIS — K219 Gastro-esophageal reflux disease without esophagitis: Secondary | ICD-10-CM | POA: Diagnosis present

## 2022-09-28 DIAGNOSIS — Z96641 Presence of right artificial hip joint: Secondary | ICD-10-CM | POA: Diagnosis present

## 2022-09-28 DIAGNOSIS — R54 Age-related physical debility: Secondary | ICD-10-CM | POA: Diagnosis present

## 2022-09-28 DIAGNOSIS — J42 Unspecified chronic bronchitis: Principal | ICD-10-CM

## 2022-09-28 DIAGNOSIS — J479 Bronchiectasis, uncomplicated: Secondary | ICD-10-CM | POA: Diagnosis present

## 2022-09-28 DIAGNOSIS — Z96649 Presence of unspecified artificial hip joint: Secondary | ICD-10-CM

## 2022-09-28 DIAGNOSIS — M1612 Unilateral primary osteoarthritis, left hip: Secondary | ICD-10-CM | POA: Diagnosis present

## 2022-09-28 DIAGNOSIS — Z7722 Contact with and (suspected) exposure to environmental tobacco smoke (acute) (chronic): Secondary | ICD-10-CM | POA: Diagnosis present

## 2022-09-28 DIAGNOSIS — Z79899 Other long term (current) drug therapy: Secondary | ICD-10-CM | POA: Diagnosis not present

## 2022-09-28 LAB — CBC
HCT: 22.4 % — ABNORMAL LOW (ref 36.0–46.0)
HCT: 30.9 % — ABNORMAL LOW (ref 36.0–46.0)
Hemoglobin: 6.7 g/dL — CL (ref 12.0–15.0)
Hemoglobin: 10 g/dL — ABNORMAL LOW (ref 12.0–15.0)
MCH: 28.8 pg (ref 26.0–34.0)
MCH: 30.1 pg (ref 26.0–34.0)
MCHC: 29.9 g/dL — ABNORMAL LOW (ref 30.0–36.0)
MCHC: 32.4 g/dL (ref 30.0–36.0)
MCV: 96.1 fL (ref 80.0–100.0)
MCV: 93.1 fL (ref 80.0–100.0)
Platelets: 169 10*3/uL (ref 150–400)
Platelets: 170 10*3/uL (ref 150–400)
RBC: 2.33 MIL/uL — ABNORMAL LOW (ref 3.87–5.11)
RBC: 3.32 MIL/uL — ABNORMAL LOW (ref 3.87–5.11)
RDW: 18.4 % — ABNORMAL HIGH (ref 11.5–15.5)
RDW: 17.8 % — ABNORMAL HIGH (ref 11.5–15.5)
WBC: 7.3 10*3/uL (ref 4.0–10.5)
WBC: 8.7 10*3/uL (ref 4.0–10.5)
nRBC: 1 % — ABNORMAL HIGH (ref 0.0–0.2)
nRBC: 1 % — ABNORMAL HIGH (ref 0.0–0.2)

## 2022-09-28 LAB — TYPE AND SCREEN
ABO/RH(D): A POS
Unit division: 0

## 2022-09-28 LAB — BPAM RBC
Blood Product Expiration Date: 202407152359
ISSUE DATE / TIME: 202406250909
ISSUE DATE / TIME: 202406251133

## 2022-09-28 LAB — PREPARE RBC (CROSSMATCH)

## 2022-09-28 MED ORDER — SODIUM CHLORIDE 0.9% IV SOLUTION: Freq: Once | INTRAVENOUS | Status: DC

## 2022-09-28 NOTE — Progress Notes (Signed)
[name]  MRN: [id number]  DOB/AGE:  1933/03/31 / 89 y.o.  1 Day Post-Op Procedure(s) (LRB): LEFT TOTAL HIP ARTHROPLASTY ANTERIOR APPROACH (Left)    PROGRESS NOTE Subjective: Patient is alert, oriented, no Nausea, no Vomiting, yes passing gas, . Taking PO well. Denies SOB, Chest or Calf Pain. Using Incentive Spirometer, PAS in place. Ambulate WBAT Patient reports pain as  2/10  .    Objective: Vital signs in last 24 hours: Vitals:   09/27/22 1745 09/27/22 2100 09/28/22 0159 09/28/22 0520  BP: 120/66 (!) 106/47 (!) 95/49 (!) 117/58  Pulse: 77 80 68 93  Resp: 14 18 18 18   Temp: 97.7 F (36.5 C) (!) 97.5 F (36.4 C) 97.9 F (36.6 C) 97.6 F (36.4 C)  TempSrc: Oral     SpO2: 96% 100% 98% 97%  Weight:      Height:          Intake/Output from previous day: I/O last 3 completed shifts: In: 4200 [P.O.:665; I.V.:3076.1; IV Piggyback:459] Out: 2200 [Urine:1650; Blood:550]   Intake/Output this shift: No intake/output data recorded.   LABORATORY DATA: Recent Labs    09/28/22 0324  WBC 7.3  HGB 6.7*  HCT 22.4*  PLT 169    Examination: Neurologically intact ABD soft Neurovascular intact Sensation intact distally Intact pulses distally Dorsiflexion/Plantar flexion intact Incision: dressing C/D/I No cellulitis present Compartment soft} XR AP&Lat of hip shows well placed\fixed THA  Assessment:   1 Day Post-Op Procedure(s) (LRB): LEFT TOTAL HIP ARTHROPLASTY ANTERIOR APPROACH (Left) ADDITIONAL DIAGNOSIS:  Expected Acute Blood Loss Anemia, GERD, bronchiectasis Anticipated LOS equal to or greater than 2 midnights due to - Age 52 and older with one or more of the following:  - Obesity  - Expected need for hospital services (PT, OT, Nursing) required for safe  discharge  - Anticipated need for postoperative skilled nursing care or inpatient rehab  -  OR   -  - Patient is a high risk of re-admission due to: Barriers to post-acute care (logistical, no  family support in home)   Plan: PT/OT WBAT, THA, Transfuse 1 unit PRBC for hgb of 6.7, social services consult for lack of home support.   DVT Prophylaxis: SCDx72 hrs, ASA 81 mg BID x 2 weeks  DISCHARGE PLAN: Home, after transfusion and passes PT. Will need home support from social services.  DISCHARGE NEEDS: HHPT, Walker, and 3-in-1 comode seatPatient ID: Melinda Valencia, female   DOB: [birth year], 89 y.o.   MRN: [id number]

## 2022-09-28 NOTE — Care Management Obs Status (Signed)
MEDICARE OBSERVATION STATUS NOTIFICATION   Patient Details  Name: Melinda Valencia MRN: [id number] Date of Birth: 13-Nov-1932   Medicare Observation Status Notification Given:  Hart Robinsons, LCSW 09/28/2022, 10:54 AM

## 2022-09-28 NOTE — Progress Notes (Addendum)
Physical Therapy Treatment Patient Details Name: Melinda Valencia MRN: [id number] DOB: March 07, 1933 Today's Date: 09/28/2022   History of Present Illness Pt is 89 yo female admitted on 09/27/22 for L anterior THA.  Pt with hx including but not limited to arthritis, GERD, bronchiectasis, and R THA in 2015    PT Comments    Pt making good progress this afternoon.  She still has c/o 10/10 pain ; however, again no overt signs of severe pain or distress and has fairly good ROM and muscle activation. She was able to ambulate 72' with RW and min guard with min A for bed mobility.  Pt making good progress; however, lives alone with limited support so needs further progression of mobility in order to return home or increased assistance at home.     Recommendations for follow up therapy are one component of a multi-disciplinary discharge planning process, led by the attending physician.  Recommendations may be updated based on patient status, additional functional criteria and insurance authorization.  Follow Up Recommendations       Assistance Recommended at Discharge Frequent or constant Supervision/Assistance  Patient can return home with the following A little help with walking and/or transfers;A little help with bathing/dressing/bathroom;Assistance with cooking/housework;Help with stairs or ramp for entrance   Equipment Recommendations  None recommended by PT    Recommendations for Other Services       Precautions / Restrictions Precautions Precautions: Fall;Knee Restrictions Weight Bearing Restrictions: Yes LLE Weight Bearing: Weight bearing as tolerated     Mobility  Bed Mobility Overal bed mobility: Needs Assistance Bed Mobility: Supine to Sit, Sit to Supine     Supine to sit: Min assist, HOB elevated Sit to supine: Min assist, HOB elevated   General bed mobility comments: Assist for L LE, increased time and cues    Transfers Overall transfer level: Needs  assistance Equipment used: Rolling walker (2 wheels) Transfers: Sit to/from Stand Sit to Stand: Min guard           General transfer comment: Min guard for safety with cues for hand placement    Ambulation/Gait Ambulation/Gait assistance: Min guard Gait Distance (Feet): 50 Feet Assistive device: Rolling walker (2 wheels) Gait Pattern/deviations: Decreased stride length, Decreased weight shift to left Gait velocity: decreased     General Gait Details: Min guard for balance and able to [employer] with cues only.  Pt has c/o pain but able to weight bear on L LE and with partial step through pattern with fair speed for POD #1.   Stairs             Wheelchair Mobility    Modified Rankin (Stroke Patients Only)       Balance Overall balance assessment: Needs assistance Sitting-balance support: No upper extremity supported Sitting balance-Leahy Scale: Good     Standing balance support: Bilateral upper extremity supported, Reliant on assistive device for balance Standing balance-Leahy Scale: Poor                              Cognition Arousal/Alertness: Awake/alert Behavior During Therapy: WFL for tasks assessed/performed Overall Cognitive Status: Within Functional Limits for tasks assessed                                 General Comments: some limitations due to [facility]        Exercises Total Joint Exercises Ankle  Circles/Pumps: AROM, Both, 10 reps, Supine Quad Sets: AROM, Both, 10 reps, Supine Heel Slides: AAROM, Left, 5 reps, Supine Hip ABduction/ADduction: AAROM, Left, 5 reps, Supine Long Arc Quad: AROM, Left, 5 reps, Seated Other Exercises Other Exercises: Provided AAROM and ROM to tolerance for pain control; limited reps with heel slides and hip abd    General Comments       Pertinent Vitals/Pain Pain Assessment Pain Assessment: 0-10 Pain Score: 10-Worst pain ever Faces Pain Scale: Hurts a little bit Pain Location: L  hip Pain Descriptors / Indicators: Discomfort, Sore Pain Intervention(s): Limited activity within patient's tolerance, Monitored during session, Premedicated before session, Repositioned, Ice applied (noted reports of pain at 10/10 but no signs of severe pain or distress  and moves leg fairly well)    Home Living Family/patient expects to be discharged to:: Private residence Living Arrangements: Alone Available Help at Discharge: Family (limited - nephew can check some but works full time) Type of Home: Independent living facility Risk manager) Home Access: Elevator       Home Layout: One level Home Equipment: Shower seat;Grab bars - tub/shower;Rollator (4 wheels);Rolling Walker (2 wheels)      Prior Function            PT Goals (current goals can now be found in the care plan section) Acute Rehab PT Goals Patient Stated Goal: return home PT Goal Formulation: With patient Time For Goal Achievement: 10/12/22 Potential to Achieve Goals: Good Progress towards PT goals: Progressing toward goals    Frequency    7X/week      PT Plan Current plan remains appropriate    Co-evaluation              AM-PAC PT "6 Clicks" Mobility   Outcome Measure  Help needed turning from your back to your side while in a flat bed without using bedrails?: A Little Help needed moving from lying on your back to sitting on the side of a flat bed without using bedrails?: A Little Help needed moving to and from a bed to a chair (including a wheelchair)?: A Little Help needed standing up from a chair using your arms (e.g., wheelchair or bedside chair)?: A Little Help needed to walk in hospital room?: A Little Help needed climbing 3-5 steps with a railing? : A Lot 6 Click Score: 17    End of Session Equipment Utilized During Treatment: Gait belt Activity Tolerance: Patient tolerated treatment well Patient left: with chair alarm set;in chair;with call bell/phone within reach Nurse  Communication: Mobility status PT Visit Diagnosis: Other abnormalities of gait and mobility (R26.89);Muscle weakness (generalized) (M62.81)     Time: 4098-1191 PT Time Calculation (min) (ACUTE ONLY): 23 min  Charges:  $Gait Training: 8-22 mins $Therapeutic Exercise: 8-22 mins                     Anise Salvo, PT Acute Rehab [facility] Re[facility] [phone]    Rayetta Humphrey 09/28/2022, 3:40 PM

## 2022-09-28 NOTE — Evaluation (Signed)
Physical Therapy Evaluation Patient Details Name: Melinda Valencia MRN: [id number] DOB: October 18, 1932 Today's Date: 09/28/2022  History of Present Illness  Pt is 89 yo female admitted on 09/27/22 for L anterior THA.  Pt with hx including but not limited to arthritis, GERD, bronchiectasis, and R THA in 2015  Clinical Impression  Pt is s/p THA resulting in the deficits listed below (see PT Problem List). At baseline, pt lives at ILF and is independent with adls, iadls, and ambulates with rollator.  She has limited support at d/c (sister who calls daily and nephew who works full time).  Today, pt's hgb was 6.7 and she was receiving her 2nd unit of PRBC during PT session.  Pt also reports pain at 10/10 but no overt signs of severe pain/distress and was premedicated.  Pt requiring min A for transfers and to ambulate 10' with R.  Pt further limited by pain.  VSS during session. Pt expected to progress well with therapy; however, with her age and lack of support at home will likely need more than average visits of PT before being safe to return home alone.  Pt will benefit from acute skilled PT to increase their independence and safety with mobility to facilitate discharge.         Recommendations for follow up therapy are one component of a multi-disciplinary discharge planning process, led by the attending physician.  Recommendations may be updated based on patient status, additional functional criteria and insurance authorization.  Follow Up Recommendations       Assistance Recommended at Discharge Frequent or constant Supervision/Assistance  Patient can return home with the following  A little help with walking and/or transfers;A little help with bathing/dressing/bathroom;Assistance with cooking/housework;Help with stairs or ramp for entrance    Equipment Recommendations None recommended by PT (reports having a RW at home)  Recommendations for Other Services       Functional Status Assessment  Patient has had a recent decline in their functional status and demonstrates the ability to make significant improvements in function in a reasonable and predictable amount of time.     Precautions / Restrictions Precautions Precautions: Fall;Knee Restrictions Weight Bearing Restrictions: Yes LLE Weight Bearing: Weight bearing as tolerated      Mobility  Bed Mobility Overal bed mobility: Needs Assistance Bed Mobility: Supine to Sit     Supine to sit: Min assist, HOB elevated     General bed mobility comments: Assist for L LE, increased time and cues    Transfers Overall transfer level: Needs assistance Equipment used: Rolling walker (2 wheels) Transfers: Sit to/from Stand Sit to Stand: Min assist           General transfer comment: Min A to rise with cues for hand placement and L LE management    Ambulation/Gait Ambulation/Gait assistance: Min assist Gait Distance (Feet): 12 Feet Assistive device: Rolling walker (2 wheels) Gait Pattern/deviations: Step-to pattern, Decreased stride length, Decreased weight shift to left Gait velocity: decreased     General Gait Details: REquiring min A for balance and RW management. Cued for sequencing and short step length for pain control  Stairs            Wheelchair Mobility    Modified Rankin (Stroke Patients Only)       Balance Overall balance assessment: Needs assistance Sitting-balance support: No upper extremity supported Sitting balance-Leahy Scale: Good     Standing balance support: Bilateral upper extremity supported, Reliant on assistive device for balance Standing balance-Leahy Scale: Poor  Pertinent Vitals/Pain Pain Assessment Pain Assessment: 0-10 Pain Score: 10-Worst pain ever Pain Location: L hip Pain Descriptors / Indicators: Discomfort Pain Intervention(s): Limited activity within patient's tolerance, Monitored during session, Premedicated before  session, Repositioned, Ice applied (noted reports of pain at 10/10 but no signs of severe pain or distress)    Home Living Family/patient expects to be discharged to:: Private residence Living Arrangements: Alone Available Help at Discharge: Family (limited - nephew can check some but works full time) Type of Home: Independent living facility Risk manager) Home Access: Elevator       Home Layout: One level Home Equipment: Shower seat;Grab bars - tub/shower;Rollator (4 wheels);Rolling Walker (2 wheels)      Prior Function Prior Level of Function : Independent/Modified Independent;Driving             Mobility Comments: ambulates with rollator; could ambulate in community ADLs Comments: Pt able to do adls and iadls     Hand Dominance        Extremity/Trunk Assessment   Upper Extremity Assessment Upper Extremity Assessment: Overall WFL for tasks assessed    Lower Extremity Assessment Lower Extremity Assessment: LLE deficits/detail;RLE deficits/detail RLE Deficits / Details: ROM WFL; MMT 5/5 LLE Deficits / Details: Expected post op changes; ROM: WFL; MMT: ankle 5/5, knee 3/5, hip1/5    Cervical / Trunk Assessment Cervical / Trunk Assessment: Kyphotic  Communication   Communication: HOH  Cognition Arousal/Alertness: Awake/alert Behavior During Therapy: WFL for tasks assessed/performed Overall Cognitive Status: Within Functional Limits for tasks assessed                                          General Comments General comments (skin integrity, edema, etc.): Note pt's hgb 6.7 this am.  She has received 1 unit PRBC and 2nd unit currently infusing    Exercises Total Joint Exercises Ankle Circles/Pumps: AROM, Both, 10 reps, Supine Quad Sets: AROM, Both, 10 reps, Supine Heel Slides: AAROM, Left, 5 reps, Supine   Assessment/Plan    PT Assessment Patient needs continued PT services  PT Problem List Decreased strength;Pain;Decreased range of  motion;Decreased activity tolerance;Decreased balance;Decreased mobility;Decreased safety awareness;Decreased knowledge of use of DME       PT Treatment Interventions DME instruction;Gait training;Balance training;Stair training;Functional mobility training;Therapeutic activities;Patient/family education;Modalities    PT Goals (Current goals can be found in the Care Plan section)  Acute Rehab PT Goals Patient Stated Goal: return home PT Goal Formulation: With patient Time For Goal Achievement: 10/12/22 Potential to Achieve Goals: Good    Frequency 7X/week     Co-evaluation               AM-PAC PT "6 Clicks" Mobility  Outcome Measure Help needed turning from your back to your side while in a flat bed without using bedrails?: A Little Help needed moving from lying on your back to sitting on the side of a flat bed without using bedrails?: A Little Help needed moving to and from a bed to a chair (including a wheelchair)?: A Little Help needed standing up from a chair using your arms (e.g., wheelchair or bedside chair)?: A Little Help needed to walk in hospital room?: Total (limited by distance) Help needed climbing 3-5 steps with a railing? : Total 6 Click Score: 14    End of Session Equipment Utilized During Treatment: Gait belt Activity Tolerance: Patient tolerated treatment well Patient left: with  chair alarm set;in chair;with call bell/phone within reach Nurse Communication: Mobility status PT Visit Diagnosis: Other abnormalities of gait and mobility (R26.89);Muscle weakness (generalized) (M62.81)    Time: 1610-9604 PT Time Calculation (min) (ACUTE ONLY): 21 min   Charges:   PT Evaluation $PT Eval Low Complexity: 1 Low          Naveh Rickles, PT Acute Rehab [facility] Rehab [phone]   Rayetta Humphrey 09/28/2022, 1:07 PM

## 2022-09-28 NOTE — Anesthesia Postprocedure Evaluation (Signed)
Anesthesia Post Note  Patient: Clois Comber  Procedure(s) Performed: LEFT TOTAL HIP ARTHROPLASTY ANTERIOR APPROACH (Left: Hip)     Patient location during evaluation: PACU Anesthesia Type: MAC and Spinal Level of consciousness: awake and alert Pain management: pain level controlled Vital Signs Assessment: post-procedure vital signs reviewed and stable Respiratory status: spontaneous breathing, nonlabored ventilation, respiratory function stable and patient connected to nasal cannula oxygen Cardiovascular status: stable and blood pressure returned to baseline Postop Assessment: no apparent nausea or vomiting Anesthetic complications: no   No notable events documented.  Last Vitals:  Vitals:   09/28/22 0853 09/28/22 0938  BP: (!) 102/51 (!) 96/46  Pulse:  91  Resp: 20 18  Temp: 36.9 C 37.2 C  SpO2: 95%     Last Pain:  Vitals:   09/28/22 0938  TempSrc: Oral  PainSc:                  Nelle Don Kynesha Guerin

## 2022-09-29 ENCOUNTER — Inpatient Hospital Stay (HOSPITAL_COMMUNITY): Payer: 59

## 2022-09-29 LAB — CBC
HCT: 31.6 % — ABNORMAL LOW (ref 36.0–46.0)
Hemoglobin: 10.3 g/dL — ABNORMAL LOW (ref 12.0–15.0)
MCH: 30.4 pg (ref 26.0–34.0)
MCHC: 32.6 g/dL (ref 30.0–36.0)
MCV: 93.2 fL (ref 80.0–100.0)
Platelets: 173 10*3/uL (ref 150–400)
RBC: 3.39 MIL/uL — ABNORMAL LOW (ref 3.87–5.11)
RDW: 17.8 % — ABNORMAL HIGH (ref 11.5–15.5)
WBC: 9.6 10*3/uL (ref 4.0–10.5)
nRBC: 0.7 % — ABNORMAL HIGH (ref 0.0–0.2)

## 2022-09-29 LAB — BPAM RBC
Blood Product Expiration Date: 202407152359
Unit Type and Rh: 6200
Unit Type and Rh: 6200

## 2022-09-29 LAB — TYPE AND SCREEN: Unit division: 0

## 2022-09-29 MED ORDER — OXYCODONE HCL 5 MG PO TABS: 5.0000 mg | ORAL_TABLET | ORAL | 0 refills | Status: AC | PRN

## 2022-09-29 MED ORDER — TIZANIDINE HCL 2 MG PO CAPS: 4.0000 mg | ORAL_CAPSULE | Freq: Three times a day (TID) | ORAL | 0 refills | Status: AC | PRN

## 2022-09-29 MED ORDER — ASPIRIN 81 MG PO TBEC: 81.0000 mg | DELAYED_RELEASE_TABLET | Freq: Every day | ORAL | 2 refills | Status: AC

## 2022-09-29 NOTE — Plan of Care (Signed)
  Problem: Activity: Goal: Ability to avoid complications of mobility impairment will improve Outcome: Progressing Goal: Ability to tolerate increased activity will improve Outcome: Progressing   Problem: Pain Management: Goal: Pain level will decrease with appropriate interventions Outcome: Progressing   Problem: Safety: Goal: Ability to remain free from injury will improve Outcome: Progressing   

## 2022-09-29 NOTE — Progress Notes (Addendum)
[name]  MRN: [id number]  DOB/AGE:  [birth year] / 89 y.o.  2 Days Post-Op Procedure(s) (LRB): LEFT TOTAL HIP ARTHROPLASTY ANTERIOR APPROACH (Left)    PROGRESS NOTE Subjective: Patient is alert, oriented, no Nausea, no Vomiting, yes passing gas, . Taking PO well. Denies SOB, Chest or Calf Pain. Using Incentive Spirometer, PAS in place. Ambulate 50" Patient reports pain as  8/10, although hip not irritable to rotation, foot tap negative  .    Objective: Vital signs in last 24 hours: Vitals:   09/28/22 1300 09/28/22 1407 09/28/22 2025 09/29/22 0537  BP: (!) 107/55 (!) 107/55 (!) 116/55 (!) 110/56  Pulse: 75 75 75 67  Resp: 18 18 17 18   Temp: 98.8 F (37.1 C) 98.8 F (37.1 C) 98.9 F (37.2 C) 98.6 F (37 C)  TempSrc: Oral Oral Oral Oral  SpO2: 98% 98% 95% 95%  Weight:      Height:          Intake/Output from previous day: I/O last 3 completed shifts: In: 4813 [P.O.:1025; I.V.:3091.3; Blood:646.7; IV Piggyback:50] Out: 1150 [Urine:1150]   Intake/Output this shift: No intake/output data recorded.   LABORATORY DATA: Recent Labs    09/28/22 1847 09/29/22 0347  WBC 8.7 9.6  HGB 10.0* 10.3*  HCT 30.9* 31.6*  PLT 170 173    Examination: Neurologically intact ABD soft Neurovascular intact Sensation intact distally Intact pulses distally Dorsiflexion/Plantar flexion intact Incision: dressing C/D/I No cellulitis present Compartment soft} XR AP&Lat of hip shows well placed\fixed THA  Assessment:   2 Days Post-Op Procedure(s) (LRB): LEFT TOTAL HIP ARTHROPLASTY ANTERIOR APPROACH (Left) ADDITIONAL DIAGNOSIS:  Expected Acute Blood Loss Anemia, bronchiectasis by hx Inpatient  Plan: AP pelvis and lateral of left hip to check prosthesis  PT/OT WBAT, THA  DVT Prophylaxis: SCDx72 hrs, ASA 81 mg BID x 2 weeks  DISCHARGE PLAN: Patient does not feel she can go home, reports minimal home support. Will request SBF  DISCHARGE NEEDS: HHPT, Walker, and  3-in-1 comode seatPatient ID: Melinda Valencia, female   DOB: 1932/08/25, 89 y.o.   MRN: [id number]

## 2022-09-29 NOTE — Discharge Instructions (Signed)

## 2022-09-29 NOTE — TOC Initial Note (Addendum)
Transition of Care [facility]) - Initial/Assessment Note    Patient Details  Name: Melinda Valencia MRN: [id number] Date of Birth: March 25, 1933  [facility]) CM/SW Contact:    Catalina Gravel, LCSW Phone Number: 09/29/2022, 1:21 PM  Clinical Narrative:                 CSW assessed pt at bedside to discuss dc plan.  Pt states she lives home alone in a small apartment.  Pt said she would like to go straight home at dc, and supported this by saying she feels safe, has a small apartment and her two walkers to use.  In terms of family or help, she states that she does not have much, she never had children and is a widow. She mentions a nephew, but he is not available to help. CSW shared that if rehab is recommended and she considers, it would be a short term stay to strengthen her walking and help while recovering and residual pain.  CSW advised PT will visit again today to work with her and then CSW will return to discuss final dc decision to return directly home or short term rehab.   TOC to follow.  1530 ADDENDUM: Met with pt again following another therapy session and recommendation still for SNF.  Pt is agreeable to short term SNF and requests [place] area.  SNF bed search begun.   Expected Discharge Plan: Home w Home Health Services (TBD) Barriers to Discharge: Continued Medical Work up   Patient Goals and CMS Choice Patient states their goals for this hospitalization and ongoing recovery are:: Pt prefers direct dc home.          Expected Discharge Plan and Services In-house Referral: Clinical Social Work     Living arrangements for the past 2 months: Apartment                 DME Arranged: N/A DME Agency: NA                  Prior Living Arrangements/Services Living arrangements for the past 2 months: Apartment Lives with:: Self Patient language and need for interpreter reviewed:: Yes Do you feel safe going back to the place where you live?: Yes      Need  for Family Participation in Patient Care: No (Comment) Care giver support system in place?: No (comment)   Criminal Activity/Legal Involvement Pertinent to Current Situation/Hospitalization: No - Comment as needed  Activities of Daily Living Home Assistive Devices/Equipment: Walker (specify type), Dentures (specify type), Hearing aid (rollator) ADL Screening (condition at time of admission) Patient's cognitive ability adequate to safely complete daily activities?: Yes Is the patient deaf or have difficulty hearing?: Yes Does the patient have difficulty seeing, even when wearing glasses/contacts?: No Does the patient have difficulty concentrating, remembering, or making decisions?: No Patient able to express need for assistance with ADLs?: Yes Does the patient have difficulty dressing or bathing?: No Independently performs ADLs?: Yes (appropriate for developmental age) Does the patient have difficulty walking or climbing stairs?: Yes Weakness of Legs: Left Weakness of Arms/Hands: None  Permission Sought/Granted Permission sought to share information with : Family Supports Permission granted to share information with : Yes, Verbal Permission Granted  Share Information with NAME: Aleda Grana [phone] [facility])     Permission granted to share info w Relationship: Sister     Emotional Assessment Appearance:: Appears stated age Attitude/Demeanor/Rapport: Engaged Affect (typically observed): Accepting Orientation: : Oriented to Self, Oriented to  Place, Oriented to Situation, Oriented to  Time Alcohol / Substance Use: Not Applicable Psych Involvement: No (comment)  Admission diagnosis:  Status post total replacement of left hip [Z96.642] Status post total replacement of hip [Z96.649] Patient Active Problem List   Diagnosis Date Noted   Chronic bronchitis, unspecified chronic bronchitis type (HCC) 09/28/2022   Status post total replacement of hip 09/28/2022   Status post total  replacement of left hip 09/27/2022   Arthritis of left hip 09/17/2022   Pre-operative respiratory examination 08/05/2022   Heartburn    Gastroesophageal reflux disease 01/13/2021   Dysphagia 04/09/2020   Cough 04/09/2020   Bronchiectasis without complication (HCC) 01/17/2020   PCP:  Lula Olszewski, MD Pharmacy:   [facility] [address], [place] - [address] [place] [address] Phone: [phone] Fax: [phone]     Social Determinants of Health (SDOH) Social History: SDOH Screenings   Food Insecurity: No Food Insecurity (09/27/2022)  Housing: Low Risk  (09/27/2022)  Transportation Needs: No Transportation Needs (09/27/2022)  Utilities: Not At Risk (09/27/2022)  Tobacco Use: Medium Risk (09/28/2022)   SDOH Interventions:     Readmission Risk Interventions     No data to display

## 2022-09-29 NOTE — [place] FL2 (Signed)
[place] MEDICAID FL2 LEVEL OF CARE FORM     IDENTIFICATION  Patient Name: Chameka Mcmullen Birthdate: [birth year] Sex: female Admission Date (Current Location): 09/27/2022  [place] and [place] Number:  Haynes Bast [id number] R Facility and Address:  [facility],  [address], [place] [address]      Provider Number: [id number]  Attending Physician Name and Address:  Gean Birchwood, MD  Relative Name and Phone Number:  Aleda Grana (Sister)  [phone] [facility])    Current Level of Care: Hospital Recommended Level of Care: Skilled Nursing Facility Prior Approval Number:    Date Approved/Denied:   PASRR Number: [id number]  Discharge Plan: SNF    Current Diagnoses: Patient Active Problem List   Diagnosis Date Noted   Chronic bronchitis, unspecified chronic bronchitis type (HCC) 09/28/2022   Status post total replacement of hip 09/28/2022   Status post total replacement of left hip 09/27/2022   Arthritis of left hip 09/17/2022   Pre-operative respiratory examination 08/05/2022   Heartburn    Gastroesophageal reflux disease 01/13/2021   Dysphagia 04/09/2020   Cough 04/09/2020   Bronchiectasis without complication (HCC) 01/17/2020    Orientation RESPIRATION BLADDER Height & Weight     Self, Time, Situation, Place  Normal Continent Weight: 126 lb (57.2 kg) Height:  5\' 2"  (157.5 cm)  BEHAVIORAL SYMPTOMS/MOOD NEUROLOGICAL BOWEL NUTRITION STATUS      Continent Diet  AMBULATORY STATUS COMMUNICATION OF NEEDS Skin   Limited Assist Verbally Normal, Surgical wounds (Dry)                       Personal Care Assistance Level of Assistance  Bathing, Feeding, Dressing Bathing Assistance: Limited assistance Feeding assistance: Limited assistance Dressing Assistance: Limited assistance     Functional Limitations Info  Hearing (Deaf R Ear Impaired Left Ear)   Hearing Info: Impaired (Deaf R Ear Impaired Left Ear)      SPECIAL CARE FACTORS FREQUENCY  OT (By  licensed OT)     PT Frequency: 5 X week OT Frequency: 5x/wk            Contractures Contractures Info: Not present    Additional Factors Info  Code Status Code Status Info: Full             Current Medications (09/29/2022):  This is the current hospital active medication list Current Facility-Administered Medications  Medication Dose Route Frequency Provider Last Rate Last Admin   0.9 %  sodium chloride infusion (Manually program via Guardrails IV Fluids)   Intravenous Once [place], Amber, PA-C       acetaminophen (TYLENOL) tablet 325-650 mg  325-650 mg Oral Q6H PRN Gean Birchwood, MD   650 mg at 09/29/22 0340   albuterol (PROVENTIL) (2.5 MG/3ML) 0.083% nebulizer solution 2.5 mg  2.5 mg Nebulization Q6H PRN Gean Birchwood, MD       aspirin chewable tablet 81 mg  81 mg Oral BID Gean Birchwood, MD   81 mg at 09/29/22 0919   bisacodyl (DULCOLAX) EC tablet 5 mg  5 mg Oral Daily PRN Gean Birchwood, MD       dextrose 5 % and 0.45 % NaCl with KCl 20 mEq/L infusion   Intravenous Continuous Gean Birchwood, MD   Stopped at 09/29/22 0345   diphenhydrAMINE (BENADRYL) 12.5 MG/5ML elixir 12.5-25 mg  12.5-25 mg Oral Q4H PRN Gean Birchwood, MD       docusate sodium (COLACE) capsule 100 mg  100 mg Oral BID Gean Birchwood, MD   100 mg  at 09/29/22 0919   HYDROmorphone (DILAUDID) injection 0.5-1 mg  0.5-1 mg Intravenous Q4H PRN Gean Birchwood, MD   0.5 mg at 09/27/22 1555   menthol-cetylpyridinium (CEPACOL) lozenge 3 mg  1 lozenge Oral PRN Gean Birchwood, MD       Or   phenol (CHLORASEPTIC) mouth spray 1 spray  1 spray Mouth/Throat PRN Gean Birchwood, MD       methocarbamol (ROBAXIN) tablet 500 mg  500 mg Oral Q6H PRN Gean Birchwood, MD       Or   methocarbamol (ROBAXIN) 500 mg in dextrose 5 % 50 mL IVPB  500 mg Intravenous Q6H PRN Gean Birchwood, MD   Stopped at 09/27/22 1625   metoCLOPramide (REGLAN) tablet 5-10 mg  5-10 mg Oral Q8H PRN Gean Birchwood, MD       Or   metoCLOPramide (REGLAN) injection 5-10 mg  5-10 mg  Intravenous Q8H PRN Gean Birchwood, MD       ondansetron [facility]) tablet 4 mg  4 mg Oral Q6H PRN Gean Birchwood, MD       Or   ondansetron [facility]) injection 4 mg  4 mg Intravenous Q6H PRN Gean Birchwood, MD       oxyCODONE (Oxy IR/ROXICODONE) immediate release tablet 5-10 mg  5-10 mg Oral Q4H PRN Gean Birchwood, MD   5 mg at 09/29/22 0925   pantoprazole (PROTONIX) EC tablet 40 mg  40 mg Oral Daily Gean Birchwood, MD   40 mg at 09/29/22 0919   pneumococcal 20-valent conjugate vaccine (PREVNAR 20) injection 0.5 mL  0.5 mL Intramuscular Tomorrow-1000 Gean Birchwood, MD       polyethylene glycol (MIRALAX / GLYCOLAX) packet 17 g  17 g Oral Daily PRN Gean Birchwood, MD       sodium phosphate (FLEET) 7-19 GM/118ML enema 1 enema  1 enema Rectal Once PRN Gean Birchwood, MD         Discharge Medications: Please see discharge summary for a list of discharge medications.  Relevant Imaging Results:  Relevant Lab Results:   Additional Information [id number]  Amada Jupiter, LCSW

## 2022-09-29 NOTE — Discharge Summary (Signed)
[name] MRN: [id number] DOB/AGE: Aug 10, 1932 89 y.o.  Admit date: 09/27/2022 Discharge date: 09/30/2022  Admission Diagnoses:  Principal Problem:   Arthritis of left hip Active Problems:   Status post total replacement of left hip   Chronic bronchitis, unspecified chronic bronchitis type (HCC)   Status post total replacement of hip   Discharge Diagnoses:  Same  Past Medical History:  Diagnosis Date   Arthritis    Second hand smoke exposure     Surgeries: Procedure(s): LEFT TOTAL HIP ARTHROPLASTY ANTERIOR APPROACH on 09/27/2022   Consultants:   Discharged Condition: Improved  Hospital Course: Melinda Valencia is an 89 y.o. female who was admitted 09/27/2022 for operative treatment ofArthritis of left hip. Patient has severe unremitting pain that affects sleep, daily activities, and work/hobbies. After pre-op clearance the patient was taken to the operating room on 09/27/2022 and underwent  Procedure(s): LEFT TOTAL HIP ARTHROPLASTY ANTERIOR APPROACH.    Patient was given perioperative antibiotics:  Anti-infectives (From admission, onward)    Start     Dose/Rate Route Frequency Ordered Stop   09/27/22 1800  ceFAZolin (ANCEF) IVPB 1 g/50 mL premix        1 g 100 mL/hr over 30 Minutes Intravenous Every 6 hours 09/27/22 1657 09/27/22 2351   09/27/22 0930  ceFAZolin (ANCEF) IVPB 2g/100 mL premix        2 g 200 mL/hr over 30 Minutes Intravenous On call to O.R. 09/27/22 0929 09/27/22 1205        Patient was given sequential compression devices, early ambulation, and chemoprophylaxis to prevent DVT. Post op day 1 hgb  was 6.7. Patient received 1 unit PRBC and hgb increased to 10.1 Patient benefited maximally from hospital stay and there were no complications.    Recent vital signs: Patient Vitals for the past 24 hrs:  BP Temp Temp src Pulse Resp SpO2  09/30/22 0611 (!) 110/58 98.6 F (37 C) Oral 81 17 95 %  09/29/22 2132 (!) 106/58 97.8 F (36.6 C) Oral 75 16  95 %     Recent laboratory studies:  Recent Labs    09/28/22 1847 09/29/22 0347  WBC 8.7 9.6  HGB 10.0* 10.3*  HCT 30.9* 31.6*  PLT 170 173     Discharge Medications:   Allergies as of 09/30/2022   No Known Allergies      Medication List     STOP taking these medications    guaiFENesin 600 MG 12 hr tablet Commonly known as: Mucinex       TAKE these medications    acetaminophen 650 MG CR tablet Commonly known as: TYLENOL Take 650 mg by mouth every 8 (eight) hours as needed for pain.   albuterol (2.5 MG/3ML) 0.083% nebulizer solution Commonly known as: PROVENTIL Take 3 mLs (2.5 mg total) by nebulization every 6 (six) hours as needed for wheezing or shortness of breath.   aspirin EC 81 MG tablet Take 1 tablet (81 mg total) by mouth daily.   CENTRUM SILVER 50+WOMEN PO Take 1 capsule by mouth daily.   dexlansoprazole 60 MG capsule Commonly known as: Dexilant Take 1 capsule (60 mg total) by mouth daily.   oxyCODONE 5 MG immediate release tablet Commonly known as: Roxicodone Take 1-2 tablets (5-10 mg total) by mouth every 4 (four) hours as needed for up to 7 days for severe pain.   tizanidine 2 MG capsule Commonly known as: Zanaflex Take 2 capsules (4 mg total) by mouth 3 (three) times daily as needed  for muscle spasms.               Durable Medical Equipment  (From admission, onward)           Start     Ordered   09/27/22 1658  DME Walker rolling  Once       Question:  Patient needs a walker to treat with the following condition  Answer:  Status post total hip replacement, left   09/27/22 1657   09/27/22 1658  DME 3 n 1  Once        09/27/22 1657              Discharge Care Instructions  (From admission, onward)           Start     Ordered   09/29/22 0000  Change dressing       Comments: Change dressing Only if drainage exceeds 40% of window on dressing   09/29/22 1803   09/29/22 0000  Change dressing       Comments: You  may change your dressing Only if drainage exceeds 40% of window on dressing   09/29/22 1803            Diagnostic Studies: DG HIP UNILAT WITH PELVIS 2-3 VIEWS LEFT  Result Date: 09/29/2022 CLINICAL DATA:  Status post LEFT total hip arthroplasty. EXAM: DG HIP (WITH OR WITHOUT PELVIS) 2-3V LEFT COMPARISON:  09/27/2022 FINDINGS: Remote RIGHT hip arthroplasty. LEFT total hip arthroplasty with normal location. Hardware is intact. No acute fractures. Postoperative soft tissue gas. IMPRESSION: LEFT total hip arthroplasty. Electronically Signed   By: Norva Pavlov M.D.   On: 09/29/2022 08:36   DG HIP UNILAT WITH PELVIS 1V LEFT  Result Date: 09/27/2022 CLINICAL DATA:  Left hip elective surgery. EXAM: DG HIP (WITH OR WITHOUT PELVIS) 1V*L* COMPARISON:  None Available. FINDINGS: Images were performed intraoperatively without the presence of a radiologist. On the initial image there is instrumentation for placement of the left acetabular cup. A marker of a surgical sponge overlies the left greater trochanter. There is also postsurgical curvilinear density overlying the more lateral left hip soft tissues. On subsequent images placement of left acetabular cup, left femoral stem, and left femoral head are seen. There is also prior total right hip arthroplasty hardware. No hardware complication is seen. Total fluoroscopy images: 8 Total fluoroscopy time: 12 seconds Total dose: Radiation Exposure Index (as provided by the fluoroscopic device): 0.76 mGy air Kerma Please see intraoperative findings for further detail. IMPRESSION: Intraoperative images during left total hip arthroplasty. Electronically Signed   By: Neita Garnet M.D.   On: 09/27/2022 13:27   DG C-Arm 1-60 Min-No Report  Result Date: 09/27/2022 Fluoroscopy was utilized by the requesting physician.  No radiographic interpretation.   DG C-Arm 1-60 Min-No Report  Result Date: 09/27/2022 Fluoroscopy was utilized by the requesting physician.  No  radiographic interpretation.    Disposition: Discharge disposition: 03-Skilled Nursing Facility       Discharge Instructions     Call MD / Call 911   Complete by: As directed    If you experience chest pain or shortness of breath, CALL 911 and be transported to the hospital emergency room.  If you develope a fever above 101 F, pus (white drainage) or increased drainage or redness at the wound, or calf pain, call your surgeon's office.   Change dressing   Complete by: As directed    Change dressing Only if drainage exceeds 40% of window  on dressing   Change dressing   Complete by: As directed    You may change your dressing Only if drainage exceeds 40% of window on dressing   Constipation Prevention   Complete by: As directed    Drink plenty of fluids.  Prune juice may be helpful.  You may use a stool softener, such as Colace (over the counter) 100 mg twice a day.  Use MiraLax (over the counter) for constipation as needed.   Diet - low sodium heart healthy   Complete by: As directed    Increase activity slowly as tolerated   Complete by: As directed    Post-operative opioid taper instructions:   Complete by: As directed    POST-OPERATIVE OPIOID TAPER INSTRUCTIONS: It is important to wean off of your opioid medication as soon as possible. If you do not need pain medication after your surgery it is ok to stop day one. Opioids include: Codeine, Hydrocodone(Norco, Vicodin), Oxycodone(Percocet, oxycontin) and hydromorphone amongst others.  Long term and even short term use of opiods can cause: Increased pain response Dependence Constipation Depression Respiratory depression And more.  Withdrawal symptoms can include Flu like symptoms Nausea, vomiting And more Techniques to manage these symptoms Hydrate well Eat regular healthy meals Stay active Use relaxation techniques(deep breathing, meditating, yoga) Do Not substitute Alcohol to help with tapering If you have been on  opioids for less than two weeks and do not have pain than it is ok to stop all together.  Plan to wean off of opioids This plan should start within one week post op of your joint replacement. Maintain the same interval or time between taking each dose and first decrease the dose.  Cut the total daily intake of opioids by one tablet each day Next start to increase the time between doses. The last dose that should be eliminated is the evening dose.           Contact information for follow-up providers     Gean Birchwood, MD Follow up.   Specialty: Orthopedic Surgery Contact information: [address] [place] [address] [phone]              Contact information for after-discharge care     Destination     [facility]erred [facility] .   Service: Skilled Nursing Contact information: [address] Test Update Address [place] [address] [phone]                      Signed: Nestor Lewandowsky 09/30/2022, 10:07 AM

## 2022-09-29 NOTE — Progress Notes (Signed)
Physical Therapy Treatment Patient Details Name: Melinda Valencia MRN: [id number] DOB: 1932/04/09 Today's Date: 09/29/2022   History of Present Illness Pt is 89 yo female admitted on 09/27/22 for L anterior THA.  Pt with hx including but not limited to arthritis, GERD, bronchiectasis, and R THA in 2015    PT Comments    POD #1  am session .  Pt not Tx as she was in the bathroom > 45 min attempted to have a BM.  Then too fatigued so was assisted back to bed. Pm session, assisted OOB to amb in hallway.   General Gait Details: Increased time with VC's on proper walker to self distance as pt is use to using a Rollator.  Also VC's on safety with turns.  Slow gait. Poor forward flexed posture. Assisted back to bed to perform a few TE's followed by ICE. Pt lives home alone and stated she has NO family that can stay with her.  "They all work", pt stated.  Pt would benefit from ST Rehab to address her functional mobility decline and gait instability post joint replacement.  Will update LPT.   Recommendations for follow up therapy are one component of a multi-disciplinary discharge planning process, led by the attending physician.  Recommendations may be updated based on patient status, additional functional criteria and insurance authorization.  Follow Up Recommendations  Can patient physically be transported by private vehicle: No    Assistance Recommended at Discharge Frequent or constant Supervision/Assistance  Patient can return home with the following A little help with walking and/or transfers;A little help with bathing/dressing/bathroom;Assistance with cooking/housework;Help with stairs or ramp for entrance   Equipment Recommendations  None recommended by PT    Recommendations for Other Services       Precautions / Restrictions Precautions Precautions: Fall;Knee Restrictions Weight Bearing Restrictions: No LLE Weight Bearing: Weight bearing as tolerated     Mobility  Bed  Mobility Overal bed mobility: Needs Assistance Bed Mobility: Supine to Sit, Sit to Supine     Supine to sit: Min assist, HOB elevated, Min guard Sit to supine: Min assist, Mod assist   General bed mobility comments: Assist for L LE, increased time and cues    Transfers Overall transfer level: Needs assistance Equipment used: Rolling walker (2 wheels) Transfers: Sit to/from Stand Sit to Stand: Min guard           General transfer comment: Min guard for safety with cues for hand placement    Ambulation/Gait Ambulation/Gait assistance: Min guard Gait Distance (Feet): 47 Feet Assistive device: Rolling walker (2 wheels) Gait Pattern/deviations: Decreased stride length, Decreased weight shift to left Gait velocity: decreased     General Gait Details: Increased time with VC's on proper walker to self distance as pt is use to using a Rollator.  Also VC's on safety with turns.  Slow gait. Poor forward flexed posture.   Stairs             Wheelchair Mobility    Modified Rankin (Stroke Patients Only)       Balance                                            Cognition Arousal/Alertness: Awake/alert Behavior During Therapy: WFL for tasks assessed/performed Overall Cognitive Status: Within Functional Limits for tasks assessed  General Comments: some limitations due to [facility]        Exercises  Total Hip Replacement TE's following HEP Handout 10 reps ankle pumps 05 reps knee presses 05 reps heel slides Instructed how to use a belt loop to assist  Followed by ICE     General Comments        Pertinent Vitals/Pain Pain Assessment Pain Assessment: Faces Faces Pain Scale: Hurts little more Pain Location: L hip Pain Descriptors / Indicators: Discomfort, Sore, Operative site guarding Pain Intervention(s): Monitored during session, Premedicated before session, Repositioned, Patient requesting pain  meds-RN notified    Home Living                          Prior Function            PT Goals (current goals can now be found in the care plan section) Progress towards PT goals: Progressing toward goals    Frequency    7X/week      PT Plan Current plan remains appropriate    Co-evaluation              AM-PAC PT "6 Clicks" Mobility   Outcome Measure  Help needed turning from your back to your side while in a flat bed without using bedrails?: A Lot Help needed moving from lying on your back to sitting on the side of a flat bed without using bedrails?: A Lot Help needed moving to and from a bed to a chair (including a wheelchair)?: A Lot Help needed standing up from a chair using your arms (e.g., wheelchair or bedside chair)?: A Lot Help needed to walk in hospital room?: A Lot Help needed climbing 3-5 steps with a railing? : Total 6 Click Score: 11    End of Session Equipment Utilized During Treatment: Gait belt Activity Tolerance: Patient tolerated treatment well Patient left: in bed;with call bell/phone within reach;with bed alarm set Nurse Communication: Mobility status PT Visit Diagnosis: Other abnormalities of gait and mobility (R26.89);Muscle weakness (generalized) (M62.81)     Time: 8469-6295 PT Time Calculation (min) (ACUTE ONLY): 29 min  Charges:  $Gait Training: 8-22 mins $Therapeutic Activity: 8-22 mins                     {Thaniel Coluccio  PTA Acute  [employer] M-F          [phone]

## 2022-09-30 NOTE — Progress Notes (Signed)
[name]  MRN: [id number]  DOB/AGE:  87-Sep-1934 / 87 y.o.  3 Days Post-Op Procedure(s) (LRB): LEFT TOTAL HIP ARTHROPLASTY ANTERIOR APPROACH (Left)    PROGRESS NOTE Subjective: Patient is alert, oriented, no Nausea, no Vomiting, yes passing gas, . Taking PO well. Denies SOB, Chest or Calf Pain. Using Incentive Spirometer, PAS in place. Ambulate 71' Patient reports pain as  4/10  .    Objective: Vital signs in last 24 hours: Vitals:   09/28/22 2025 09/29/22 0537 09/29/22 2132 09/30/22 0611  BP: (!) 116/55 (!) 110/56 (!) 106/58 (!) 110/58  Pulse: 75 67 75 81  Resp: 17 18 16 17   Temp: 98.9 F (37.2 C) 98.6 F (37 C) 97.8 F (36.6 C) 98.6 F (37 C)  TempSrc: Oral Oral Oral Oral  SpO2: 95% 95% 95% 95%  Weight:      Height:          Intake/Output from previous day: I/O last 3 completed shifts: In: 1965.7 [P.O.:780; I.V.:1185.7] Out: -    Intake/Output this shift: No intake/output data recorded.   LABORATORY DATA: Recent Labs    09/28/22 1847 09/29/22 0347  WBC 8.7 9.6  HGB 10.0* 10.3*  HCT 30.9* 31.6*  PLT 170 173    Examination: Neurologically intact ABD soft Neurovascular intact Sensation intact distally Intact pulses distally Dorsiflexion/Plantar flexion intact Incision: dressing C/D/I No cellulitis present Compartment soft} XR AP&Lat of hip shows well placed\fixed THA  Assessment:   3 Days Post-Op Procedure(s) (LRB): LEFT TOTAL HIP ARTHROPLASTY ANTERIOR APPROACH (Left) ADDITIONAL DIAGNOSIS:  Expected Acute Blood Loss Anemia, GERD, bronchiectasis Inpt  Plan: PT/OT WBAT, THA,  DVT Prophylaxis: SCDx72 hrs, ASA 81 mg BID x 2 weeks  DISCHARGE PLAN: Skilled Nursing Facility/Rehab, hopefully today  DISCHARGE NEEDS: HHPT, Walker, and 3-in-1 comode seatPatient ID: Melinda Valencia, female   DOB: [birth year], 89 y.o.   MRN: [id number]

## 2022-09-30 NOTE — Plan of Care (Signed)
  Problem: Activity: Goal: Ability to avoid complications of mobility impairment will improve Outcome: Progressing Goal: Ability to tolerate increased activity will improve Outcome: Progressing   Problem: Pain Management: Goal: Pain level will decrease with appropriate interventions Outcome: Progressing   

## 2022-09-30 NOTE — Plan of Care (Signed)
  Problem: Education: Goal: Knowledge of the prescribed therapeutic regimen will improve Outcome: Progressing   Problem: Activity: Goal: Ability to tolerate increased activity will improve Outcome: Progressing   Problem: Pain Management: Goal: Pain level will decrease with appropriate interventions Outcome: Progressing   

## 2022-09-30 NOTE — TOC Transition Note (Signed)
Transition of Care [facility]) - CM/SW Discharge Note   Patient Details  Name: Melinda Valencia MRN: [id number] Date of Birth: 03/19/1933  Transition of Care [facility]) CM/SW Contact:  Amada Jupiter, LCSW Phone Number: 09/30/2022, 12:58 PM   Clinical Narrative:     Met with pt this morning to review SNF bed offers and pt has accepted bed at [facility] who can admit her today.  Have received insurance authorization.  MD has medically cleared.  Pt and sister aware and agreeable with dc to SNF today.  PTAR called at 12:55pm.  RN to call report to [phone].  Final next level of care: Skilled Nursing Facility Barriers to Discharge: Barriers Resolved   Patient Goals and CMS Choice      Discharge Placement PASRR number recieved: 09/29/22 PASRR number recieved: 09/29/22            Patient chooses bed at: [facility] Patient to be transferred to facility by: PTAR Name of family member notified: sister, Aleda Grana Patient and family notified of of transfer: 09/30/22  Discharge Plan and Services Additional resources added to the After Visit Summary for   In-house Referral: Clinical Social Work              DME Arranged: N/A DME Agency: NA                  Social Determinants of Health (SDOH) Interventions SDOH Screenings   Food Insecurity: No Food Insecurity (09/27/2022)  Housing: Low Risk  (09/27/2022)  Transportation Needs: No Transportation Needs (09/27/2022)  Utilities: Not At Risk (09/27/2022)  Tobacco Use: Medium Risk (09/28/2022)     Readmission Risk Interventions    09/30/2022   12:57 PM  Readmission Risk Prevention Plan  Post Dischage Appt Complete  Medication Screening Complete  Transportation Screening Complete

## 2022-09-30 NOTE — Progress Notes (Signed)
Called [facility] report given to [place], LPN.

## 2022-09-30 NOTE — Progress Notes (Signed)
Physical Therapy Treatment Patient Details Name: Melinda Valencia MRN: [id number] DOB: 04-10-1932 Today's Date: 09/30/2022   History of Present Illness Pt is 89 yo female admitted on 09/27/22 for L anterior THA.  Pt with hx including but not limited to arthritis, GERD, bronchiectasis, and R THA in 2015    PT Comments    POD # 3 am session AxO x 3 very sweet Lady who lives home alone.  Assisted OOB to amb in hallway.  General transfer comment: Min guard for safety with cues for hand placement.  General Gait Details: Increased time with VC's on proper walker to self distance as pt is use to using a Rollator.  Also VC's on safety with turns.  Slow gait. Poor forward flexed posture. Then returned to room to perform some TE's following HEP handout.  Instructed on proper tech, freq as well as use of ICE.   Pt will need ST Rehab at SNF to address mobility and functional decline prior to safely returning home.   Recommendations for follow up therapy are one component of a multi-disciplinary discharge planning process, led by the attending physician.  Recommendations may be updated based on patient status, additional functional criteria and insurance authorization.  Follow Up Recommendations  Can patient physically be transported by private vehicle: No    Assistance Recommended at Discharge Frequent or constant Supervision/Assistance  Patient can return home with the following A little help with walking and/or transfers;A little help with bathing/dressing/bathroom;Assistance with cooking/housework;Help with stairs or ramp for entrance   Equipment Recommendations  None recommended by PT    Recommendations for Other Services       Precautions / Restrictions Precautions Precautions: Fall Restrictions Weight Bearing Restrictions: No LLE Weight Bearing: Weight bearing as tolerated     Mobility  Bed Mobility Overal bed mobility: Needs Assistance Bed Mobility: Supine to Sit, Sit to Supine      Supine to sit: Min assist, HOB elevated, Min guard Sit to supine: Min assist, Mod assist   General bed mobility comments: Assist for L LE, increased time and cues    Transfers Overall transfer level: Needs assistance Equipment used: Rolling walker (2 wheels) Transfers: Sit to/from Stand Sit to Stand: Min guard           General transfer comment: Min guard for safety with cues for hand placement    Ambulation/Gait Ambulation/Gait assistance: Min guard, Min assist Gait Distance (Feet): 34 Feet Assistive device: Rolling walker (2 wheels) Gait Pattern/deviations: Decreased stride length, Decreased weight shift to left Gait velocity: decreased     General Gait Details: Increased time with VC's on proper walker to self distance as pt is use to using a Rollator.  Also VC's on safety with turns.  Slow gait. Poor forward flexed posture.   Stairs             Wheelchair Mobility    Modified Rankin (Stroke Patients Only)       Balance                                            Cognition Arousal/Alertness: Awake/alert Behavior During Therapy: WFL for tasks assessed/performed Overall Cognitive Status: Within Functional Limits for tasks assessed  General Comments: some limitations due to [facility]        Exercises  Total Hip Replacement TE's following HEP Handout 10 reps ankle pumps 05 reps knee presses 05 reps heel slides 05 reps SAQ's 05 reps ABD Instructed how to use a belt loop to assist  Followed by ICE     General Comments        Pertinent Vitals/Pain Pain Assessment Pain Assessment: Faces Faces Pain Scale: Hurts little more Pain Location: L hip Pain Descriptors / Indicators: Discomfort, Sore, Operative site guarding Pain Intervention(s): Monitored during session, Repositioned, Ice applied, Premedicated before session    Home Living                          Prior Function             PT Goals (current goals can now be found in the care plan section) Progress towards PT goals: Progressing toward goals    Frequency    7X/week      PT Plan Current plan remains appropriate    Co-evaluation              AM-PAC PT "6 Clicks" Mobility   Outcome Measure  Help needed turning from your back to your side while in a flat bed without using bedrails?: A Lot Help needed moving from lying on your back to sitting on the side of a flat bed without using bedrails?: A Lot Help needed moving to and from a bed to a chair (including a wheelchair)?: A Lot Help needed standing up from a chair using your arms (e.g., wheelchair or bedside chair)?: A Lot Help needed to walk in hospital room?: A Lot Help needed climbing 3-5 steps with a railing? : Total 6 Click Score: 11    End of Session Equipment Utilized During Treatment: Gait belt Activity Tolerance: Patient tolerated treatment well Patient left: in bed;with call bell/phone within reach;with bed alarm set Nurse Communication: Mobility status PT Visit Diagnosis: Other abnormalities of gait and mobility (R26.89);Muscle weakness (generalized) (M62.81)     Time: 1000-1024 PT Time Calculation (min) (ACUTE ONLY): 24 min  Charges:  $Gait Training: 8-22 mins $Therapeutic Exercise: 8-22 mins                     Felecia Shelling  PTA Acute  Rehabilitation Services Office M-F          [phone]

## 2022-10-01 DIAGNOSIS — R1310 Dysphagia, unspecified: Secondary | ICD-10-CM

## 2022-10-01 DIAGNOSIS — M6281 Muscle weakness (generalized): Secondary | ICD-10-CM | POA: Diagnosis not present

## 2022-10-01 DIAGNOSIS — R2681 Unsteadiness on feet: Secondary | ICD-10-CM | POA: Diagnosis not present

## 2022-10-01 DIAGNOSIS — R2689 Other abnormalities of gait and mobility: Secondary | ICD-10-CM | POA: Diagnosis not present

## 2022-10-04 DIAGNOSIS — R2689 Other abnormalities of gait and mobility: Secondary | ICD-10-CM

## 2022-10-04 DIAGNOSIS — M6281 Muscle weakness (generalized): Secondary | ICD-10-CM | POA: Diagnosis not present

## 2022-10-04 DIAGNOSIS — Z4789 Encounter for other orthopedic aftercare: Secondary | ICD-10-CM | POA: Diagnosis not present

## 2022-10-04 DIAGNOSIS — R1310 Dysphagia, unspecified: Secondary | ICD-10-CM | POA: Diagnosis not present

## 2022-10-31 NOTE — Progress Notes (Deleted)
Synopsis: Referred in Oct 2021 for cough by Lula Olszewski, MD  Subjective:   [name] GENDER: female DOB: Jul 11, 1932, MRN: [id number]  No chief complaint on file.   89 yo FM, PMH no signifigant history except hip surgery and second hand smoke exposure. C/o cough. She has had cough for about 10 years. Her cough occurs mainly at night time. She has episodic flares, thick white mucus. When she drinks water and tea and when she drinks stuff and she coughs. She occasionally gets choked on foods. 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  History is somewhat limited due to the patient's difficulty hearing.  However with talking slow and loudly she is able to understand well.  Her biggest issue is the ongoing trouble with coughing.  She has not had any blood.  Denies hemoptysis.  No recent sick contacts.  Weight has been stable.  OV 08/06/2020: Patient here today for ongoing complaints of cough.  She has difficulty coughing still with eating and drinking.  Liquids seem to cause persistent problem.  I reviewed her recent GI consultation.  She has been on acid suppression with little improvement.  Also reviewed esophagram.  Has a to and fro motion of the fluid collection within the proximal esophagus with swallowing.  Also has esophageal dysmotility on MBS S.  OV 05/15/2021: Here today for follow-up with cough.  Had GI evaluation with esophageal dysmotility. She has lower lobe bronchiectasis.  She has not been doing any of her airways clearance techniques.  Not been using her flutter valve and not been using her nebulizer.  We reviewed her CT results again today in the office.  OV 11/19/2021: Here today for follow-up with cough.  She has had GI evaluation in the past documenting esophageal dysmotility.  She has lower lobe bronchiectasis consistent with recurrent aspiration into the airway.Last seen in the office in February 2023.  Asked to be seen today  after she had an episode with therapist where she was drinking fluid and got strangled and was coughing for about 30 minutes afterwards.  She is still coughing all of the time.  I expect that she is aspirating.  We had an in-depth discussion today with her nephew regarding management of bronchiectasis and what that takes.  We talked about the pros and cons of using opiates to blunt the cough reflex.  We talked about the use of vest therapy.  She does think the able to do that since she lives alone.  OV 11/01/2022: Here today for follow-up.  Patient esophageal dysmotility recurrent aspiration into the airway.  Manage for bronchiectasis.  The last seen Lynnae January, NP in clinic.  Patient was seen for preop clearance for planned left total hip arthroplasty.  She had this completed on September 27, 2022.    Past Medical History:  Diagnosis Date   Arthritis    Second hand smoke exposure      Family History  Problem Relation Age of Onset   Breast cancer Mother    Diabetes Father      Past Surgical History:  Procedure Laterality Date   CATARACT EXTRACTION Right 05/01/2013   CATARACT EXTRACTION Left 11/15/2013   ESOPHAGEAL MANOMETRY N/A 09/15/2020   Procedure: ESOPHAGEAL MANOMETRY (EM);  Surgeon: Shellia Cleverly, DO;  Location: [facility];  Service: Endoscopy;  Laterality: N/A;   PH IMPEDANCE STUDY  09/15/2020   Procedure: PH IMPEDANCE STUDY;  Surgeon: Shellia Cleverly, DO;  Location:  [facility];  Service: Endoscopy;;   [facility] IMPEDANCE STUDY  04/15/2021   Procedure: PH IMPEDANCE STUDY;  Surgeon: Shellia Cleverly, DO;  Location: [facility];  Service: Gastroenterology;;   TONSILLECTOMY     age 11   TOTAL HIP ARTHROPLASTY Right 05/28/2013   TOTAL HIP ARTHROPLASTY Left 09/27/2022   Procedure: LEFT TOTAL HIP ARTHROPLASTY ANTERIOR APPROACH;  Surgeon: Gean Birchwood, MD;  Location: [facility];  Service: Orthopedics;  Laterality: Left;    Social History   Socioeconomic History   Marital status:  Single    Spouse name: Not on file   Number of children: 0   Years of education: Not on file   Highest education level: Not on file  Occupational History   Occupation: retired  Tobacco Use   Smoking status: Never    Passive exposure: Yes   Smokeless tobacco: Never  Vaping Use   Vaping status: Never Used  Substance and Sexual Activity   Alcohol use: Not Currently    Comment: social 10-15 years ago 04/09/2020   Drug use: Never   Sexual activity: Not on file  Other Topics Concern   Not on file  Social History Narrative   Not on file   Social Determinants of Health   Financial Resource Strain: Not on file  Food Insecurity: No Food Insecurity (09/27/2022)   Hunger Vital Sign    Worried About Running Out of Food in the Last Year: Never true    Ran Out of Food in the Last Year: Never true  Transportation Needs: No Transportation Needs (09/27/2022)   PRAPARE - Administrator, Civil Service (Medical): No    Lack of Transportation (Non-Medical): No  Physical Activity: Not on file  Stress: Not on file  Social Connections: Not on file  Intimate Partner Violence: Not At Risk (09/27/2022)   Humiliation, Afraid, Rape, and Kick questionnaire    Fear of Current or Ex-Partner: No    Emotionally Abused: No    Physically Abused: No    Sexually Abused: No     No Known Allergies   Outpatient Medications Prior to Visit  Medication Sig Dispense Refill   acetaminophen (TYLENOL) 650 MG CR tablet Take 650 mg by mouth every 8 (eight) hours as needed for pain.     albuterol (PROVENTIL) (2.5 MG/3ML) 0.083% nebulizer solution Take 3 mLs (2.5 mg total) by nebulization every 6 (six) hours as needed for wheezing or shortness of breath. 75 mL 12   aspirin EC 81 MG tablet Take 1 tablet (81 mg total) by mouth daily. 30 tablet 2   dexlansoprazole (DEXILANT) 60 MG capsule Take 1 capsule (60 mg total) by mouth daily. (Patient not taking: Reported on 05/15/2021) 60 capsule 5   Multiple  Vitamins-Minerals (CENTRUM SILVER 50+WOMEN PO) Take 1 capsule by mouth daily.     tizanidine (ZANAFLEX) 2 MG capsule Take 2 capsules (4 mg total) by mouth 3 (three) times daily as needed for muscle spasms. 60 capsule 0   No facility-administered medications prior to visit.    Review of Systems  Constitutional:  Negative for chills, fever, malaise/fatigue and weight loss.  HENT:  Negative for hearing loss, sore throat and tinnitus.   Eyes:  Negative for blurred vision and double vision.  Respiratory:  Positive for cough and sputum production. Negative for hemoptysis, shortness of breath, wheezing and stridor.   Cardiovascular:  Negative for chest pain, palpitations, orthopnea, leg swelling and PND.  Gastrointestinal:  Negative for abdominal pain, constipation, diarrhea,  heartburn, nausea and vomiting.  Genitourinary:  Negative for dysuria, hematuria and urgency.  Musculoskeletal:  Negative for joint pain and myalgias.  Skin:  Negative for itching and rash.  Neurological:  Negative for dizziness, tingling, weakness and headaches.  Endo/Heme/Allergies:  Negative for environmental allergies. Does not bruise/bleed easily.  Psychiatric/Behavioral:  Negative for depression. The patient is not nervous/anxious and does not have insomnia.   All other systems reviewed and are negative.    Objective:  Physical Exam Vitals reviewed.  Constitutional:      General: She is not in acute distress.    Appearance: She is well-developed.  HENT:     Head: Normocephalic and atraumatic.  Eyes:     General: No scleral icterus.    Conjunctiva/sclera: Conjunctivae normal.     Pupils: Pupils are equal, round, and reactive to light.  Neck:     Vascular: No JVD.     Trachea: No tracheal deviation.  Cardiovascular:     Rate and Rhythm: Normal rate and regular rhythm.     Heart sounds: Normal heart sounds. No murmur heard. Pulmonary:     Effort: Pulmonary effort is normal. No tachypnea, accessory muscle  usage or respiratory distress.     Breath sounds: No stridor. Rhonchi present. No wheezing or rales.  Abdominal:     General: There is no distension.     Palpations: Abdomen is soft.     Tenderness: There is no abdominal tenderness.  Musculoskeletal:        General: No tenderness.     Cervical back: Neck supple.  Lymphadenopathy:     Cervical: No cervical adenopathy.  Skin:    General: Skin is warm and dry.     Capillary Refill: Capillary refill takes less than 2 seconds.     Findings: No rash.  Neurological:     Mental Status: She is alert and oriented to person, place, and time.  Psychiatric:        Behavior: Behavior normal.      There were no vitals filed for this visit.    on RA BMI Readings from Last 3 Encounters:  09/27/22 23.05 kg/m  09/14/22 22.68 kg/m  08/05/22 23.70 kg/m   Wt Readings from Last 3 Encounters:  09/27/22 126 lb (57.2 kg)  09/14/22 124 lb (56.2 kg)  08/05/22 129 lb 9.6 oz (58.8 kg)     CBC    Component Value Date/Time   WBC 9.6 09/29/2022 0347   RBC 3.39 (L) 09/29/2022 0347   HGB 10.3 (L) 09/29/2022 0347   HCT 31.6 (L) 09/29/2022 0347   PLT 173 09/29/2022 0347   MCV 93.2 09/29/2022 0347   MCH 30.4 09/29/2022 0347   MCHC 32.6 09/29/2022 0347   RDW 17.8 (H) 09/29/2022 0347      Chest Imaging: August 2021 CT chest: Bilateral lower lobe tubular bronchiectasis with scattered tree-in-bud. The patient's images have been independently reviewed by me.    Pulmonary Functions Testing Results:    Latest Ref Rng & Units 08/24/2022    9:03 AM  PFT Results  FVC-Pre L 2.02   FVC-Predicted Pre % 98   Pre FEV1/FVC % % 69   FEV1-Pre L 1.39   FEV1-Predicted Pre % 92   DLCO uncorrected ml/min/mmHg 12.11   DLCO UNC% % 70   DLCO corrected ml/min/mmHg 12.11   DLCO COR %Predicted % 70   DLVA Predicted % 96     FeNO:   Pathology:   Echocardiogram:   Heart Catheterization:  Assessment & Plan:     ICD-10-CM   1. Bronchiectasis  without complication (HCC)  J47.9     2. Esophageal dysphagia  R13.19     3. Chronic bronchitis, unspecified chronic bronchitis type (HCC)  J42     4. Chronic cough  R05.3          Discussion:  This is an 89 year old female with difficulty hearing, ongoing issues related to chronic cough.  She has been on empiric bronchodilators she had an MBSS and a barium esophagram shows mild esophageal dysmotility.  She has a CT scan that demonstrates lower lobe bronchiectasis.  She has had multiple encounters documented where she had aspiration events while eating and drinking and coughing afterward.  Plan: I think she is aspirating on a daily basis when she eats. She needs to have a cough to be able to clear her airways. She has dilated bronchiectasis in her lower lobes. This is not going to get better I showed the images to her nephew and reviewed treatment regimen. This is predominantly a mechanical problem for her. She needs to use her flutter valve daily. We talked about the use of vest therapy but they do not believe she is able to do this since she lives alone. She needs to continue her current inhaler regimen. We talked about the use of opiates to blunt the cough reflex but she needs to cough to clear her airways and there is pros and cons associated with the use of opiates.  Since she lives alone they feel like the use of opiates to help blunt cough reflex is also not a good idea because it may increase her risk of falling. I agree with this. She can follow-up with [place] in a couple of months for management otherwise continue the current inhaler regimen, bronchodilators and daily flutter valve therapy.   Current Outpatient Medications:    acetaminophen (TYLENOL) 650 MG CR tablet, Take 650 mg by mouth every 8 (eight) hours as needed for pain., Disp: , Rfl:    albuterol (PROVENTIL) (2.5 MG/3ML) 0.083% nebulizer solution, Take 3 mLs (2.5 mg total) by nebulization every 6 (six) hours as needed  for wheezing or shortness of breath., Disp: 75 mL, Rfl: 12   aspirin EC 81 MG tablet, Take 1 tablet (81 mg total) by mouth daily., Disp: 30 tablet, Rfl: 2   dexlansoprazole (DEXILANT) 60 MG capsule, Take 1 capsule (60 mg total) by mouth daily. (Patient not taking: Reported on 05/15/2021), Disp: 60 capsule, Rfl: 5   Multiple Vitamins-Minerals (CENTRUM SILVER 50+WOMEN PO), Take 1 capsule by mouth daily., Disp: , Rfl:    tizanidine (ZANAFLEX) 2 MG capsule, Take 2 capsules (4 mg total) by mouth 3 (three) times daily as needed for muscle spasms., Disp: 60 capsule, Rfl: 0    Josephine Igo, DO [facility] 10/31/2022 2:26 PM

## 2022-11-01 ENCOUNTER — Ambulatory Visit: Payer: 59 | Admitting: Pulmonary Disease

## 2022-11-01 DIAGNOSIS — R053 Chronic cough: Secondary | ICD-10-CM

## 2022-11-01 DIAGNOSIS — R1319 Other dysphagia: Secondary | ICD-10-CM

## 2022-11-01 DIAGNOSIS — J42 Unspecified chronic bronchitis: Secondary | ICD-10-CM

## 2022-11-01 DIAGNOSIS — J479 Bronchiectasis, uncomplicated: Secondary | ICD-10-CM

## 2023-07-25 IMAGING — CT CT CHEST-ABD-PELV W/ CM
2 of 5 series · 11 of 46 positions shown, 12 images · IV contrast (iopamidol)
Comparison: CT chest 12/04/2019

CLINICAL DATA: Chronic cough, dysphagia, unintended weight loss,
pulmonary nodule. Urinary incontinence. No history of malignancy.

EXAM:
CT CHEST, ABDOMEN, AND PELVIS WITH CONTRAST
TECHNIQUE: Multidetector CT imaging of the chest, abdomen and pelvis was
performed following the standard protocol during bolus
administration of intravenous contrast.
CONTRAST:  100mL [id number] IOPAMIDOL ([id number]) INJECTION 61%

[Series 2: cap with 5.00 br40 s3 axial · axial · 0.66mm/px · z∈[+1414,+1899]mm · 8 of 119 slices shown, 9 images]
[im 11/119  soft-tissue]
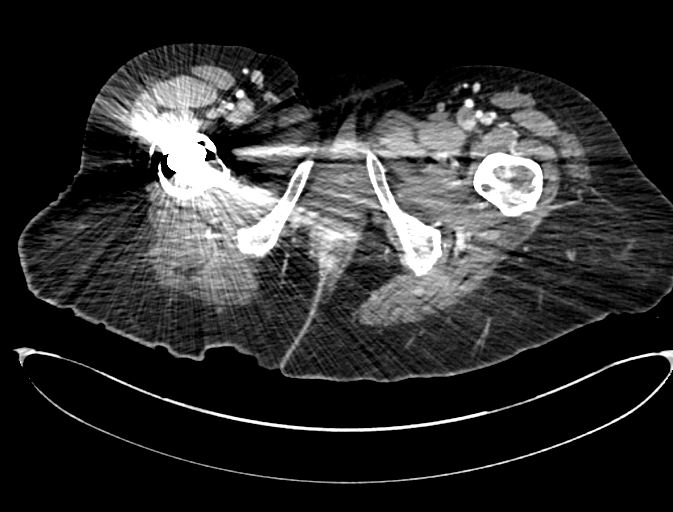
[im 11/119  bone]
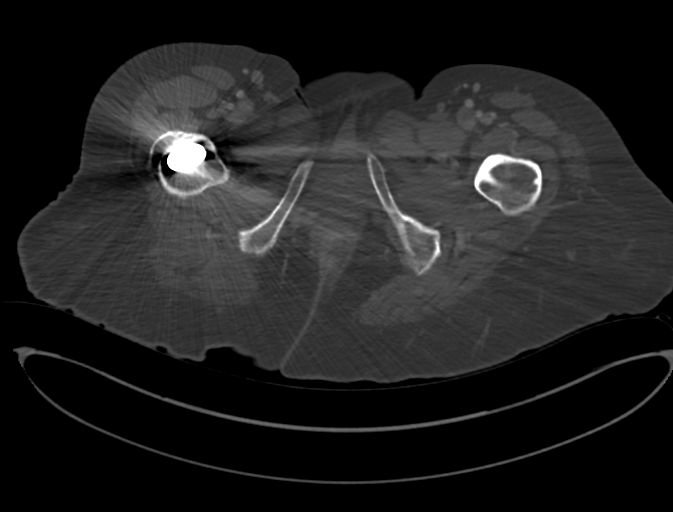
[im 22/119  soft-tissue]
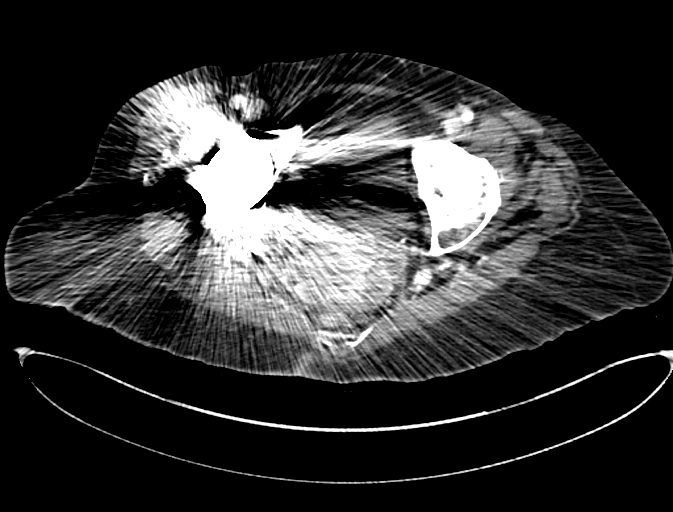
[im 43/119  soft-tissue]
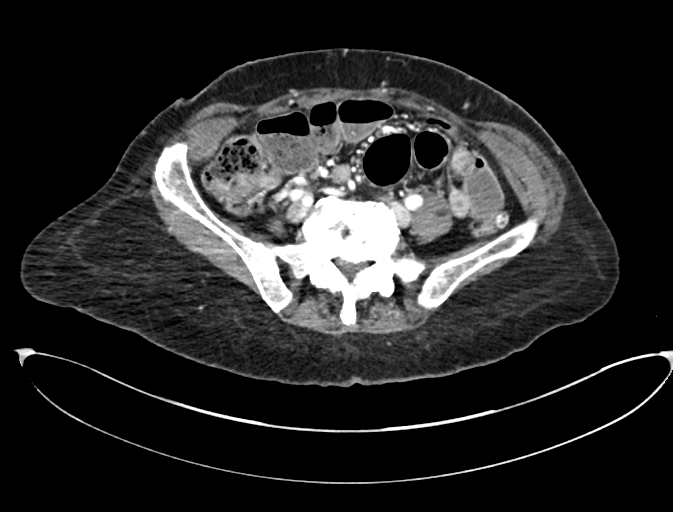
[im 54/119  soft-tissue]
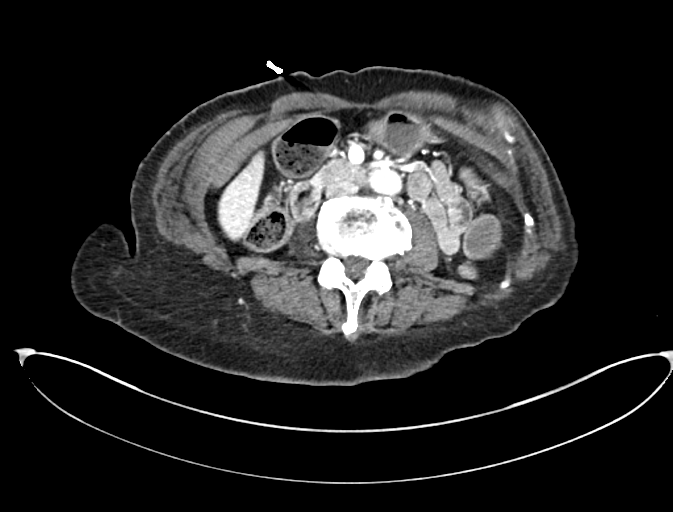
[im 65/119  soft-tissue]
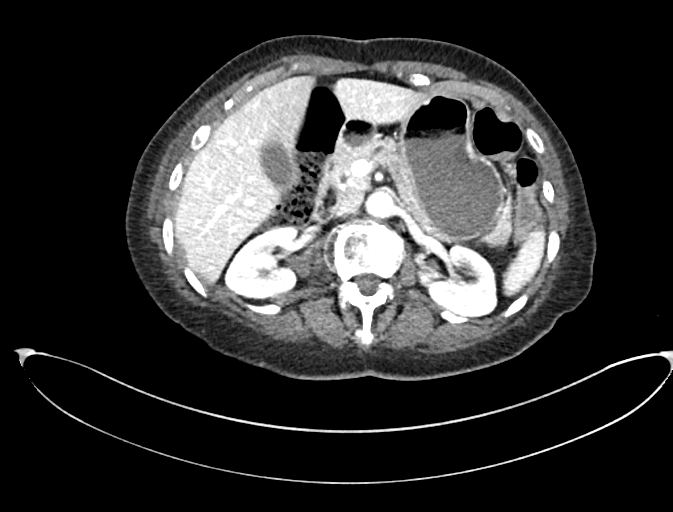
[im 76/119  soft-tissue]
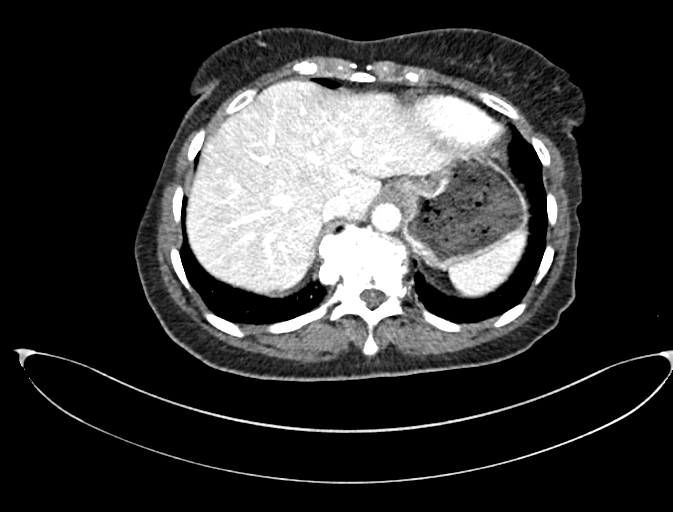
[im 97/119  soft-tissue]
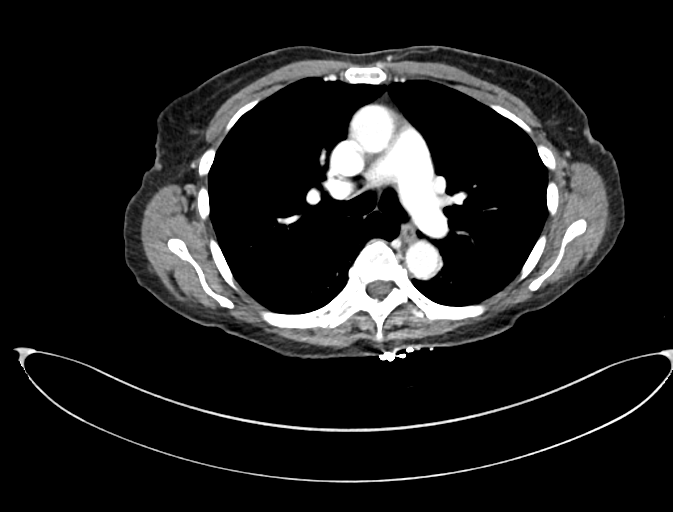
[im 108/119  soft-tissue]
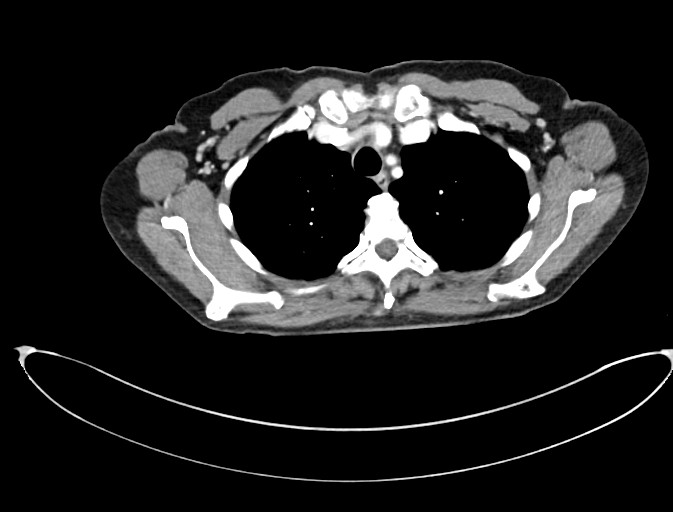

[Series 6: cap with 2.00 br40 s3 cor · coronal · 0.82mm/px · 3 of 123 slices shown]
[im 41/123  soft-tissue]
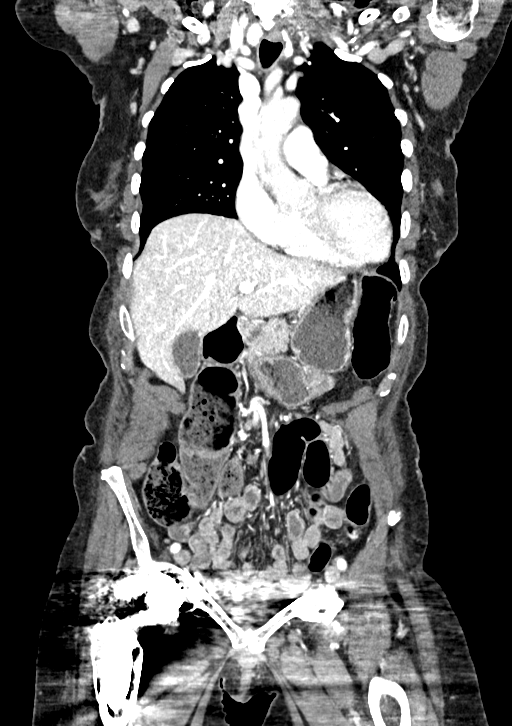
[im 55/123  soft-tissue]
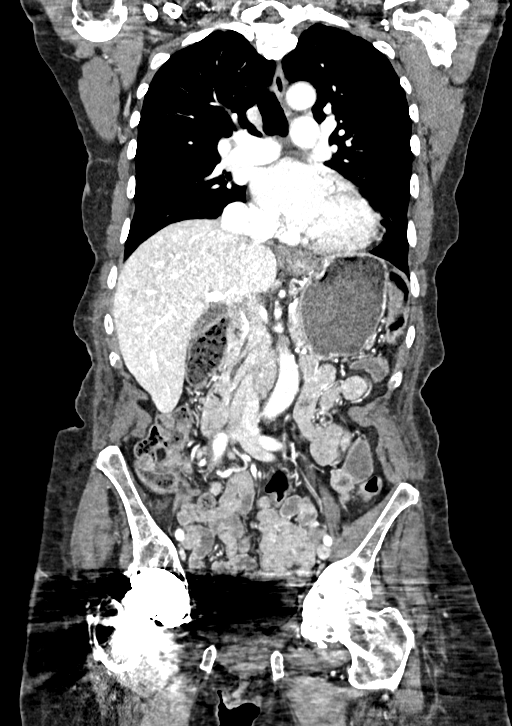
[im 68/123  soft-tissue]
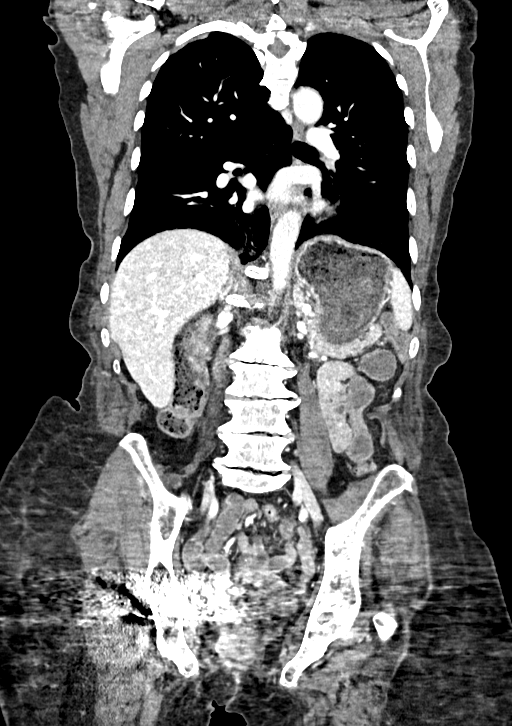

[11 of 46 positions shown; findings below may reference images not displayed]

FINDINGS: CT CHEST FINDINGS

Cardiovascular: No significant vascular findings. Normal heart size.
No pericardial effusion. Calcifications of the aortic arch.

Mediastinum/Nodes: No enlarged mediastinal, hilar, or axillary lymph
nodes. The trachea and esophagus demonstrate no significant
findings. Three circumscribed hypodense nodules are noted in the
right thyroid measuring up to 0.6 cm (series 2, images 6-7).

Lungs/Pleura: Multiple tiny ground-glass nodules are noted
throughout the lungs, measuring between 0.2-0.4 cm (series 90000,
images 24, 52, 59). Dependent subsegmental atelectasis. Reticulation
and bronchiectasis noted in the medial lung bases, consistent with
fibrotic changes. No pleural effusion or pneumothorax.

Musculoskeletal: Moderate osteoarthritis of the right glenohumeral
joint. Multilevel degenerative changes throughout the thoracic
spine. No suspicious osseous lesion. No acute osseous abnormality.

CT ABDOMEN PELVIS FINDINGS

Hepatobiliary: No focal liver abnormality is seen. No gallstones,
gallbladder wall thickening, or biliary dilatation.

Pancreas: Pancreatic divisum. No pancreatic ductal dilatation or
surrounding inflammatory changes.

Spleen: Normal in size without focal abnormality.

Adrenals/Urinary Tract: Adrenal glands are unremarkable. Kidneys are
normal, without renal calculi, focal lesion, or hydronephrosis.
Bladder is unremarkable.

Stomach/Bowel: Distended fluid-filled stomach. No abnormal gastric
wall thickening. Appendix appears normal. Scattered diverticula of
the descending and sigmoid colon. No evidence of bowel wall
thickening, distention, or inflammatory changes.

Vascular/Lymphatic: No significant vascular findings are present. No
enlarged abdominal or pelvic lymph nodes.

Reproductive: Calcified uterine fibroids. The adnexa are not well
visualized due to streak artifact from right hip prosthesis.

Other: No abdominal wall hernia or abnormality. No abdominopelvic
ascites.

Musculoskeletal: Right hip prosthesis is intact and in appropriate
position. Severe osteoarthritis of the left hip joint multilevel
severe degenerative disc disease throughout the lumbar spine with
vacuum disc phenomenon. Complete osseous fusion across the L1-L2
interspace. Multilevel bilateral hypertrophic facet arthropathy. No
suspicious osseous lesion.
IMPRESSION: CT chest:

1. Multiple diffuse ground-glass pulmonary nodules, which are
usually benign. Recommend follow-up CT chest in 3-6 months.
2. Mild fibrotic changes at the lung bases.
3. Subcentimeter incidental right thyroid nodules. No follow-up
imaging is recommended.
4.  Aortic Atherosclerosis ([id number].V).

CT abdomen and pelvis:

1. No acute finding in the abdomen or pelvis.
2. Scattered diverticula of the descending and sigmoid colon without
findings of diverticulitis.

## 2023-09-22 ENCOUNTER — Other Ambulatory Visit: Payer: Self-pay | Admitting: Family Medicine

## 2023-09-22 DIAGNOSIS — Z1231 Encounter for screening mammogram for malignant neoplasm of breast: Secondary | ICD-10-CM

## 2023-10-06 ENCOUNTER — Ambulatory Visit
Admission: RE | Admit: 2023-10-06 | Discharge: 2023-10-06 | Disposition: A | Discharge status: Home / Self Care | Source: Ambulatory Visit | Attending: Family Medicine | Admitting: Family Medicine

## 2023-10-06 DIAGNOSIS — Z1231 Encounter for screening mammogram for malignant neoplasm of breast: Secondary | ICD-10-CM

## 2023-11-19 IMAGING — CT CT CHEST W/O CM
2 of 5 series · 15 of 36 positions shown, 18 images · non-contrast
Comparison: 01/23/2021, 12/04/2019

CLINICAL DATA: Follow-up pulmonary nodules, history of chronic
cough



[Series 4: chest 2.00 br40 s3 · coronal · 0.60mm/px · 3 of 135 slices shown]
[im 27/135  lung]
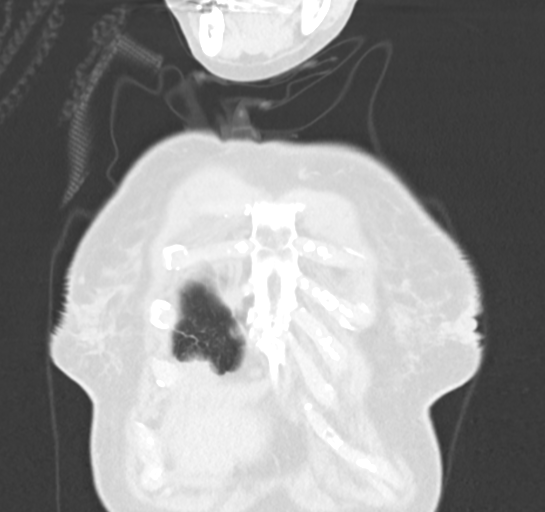
[im 54/135  lung]
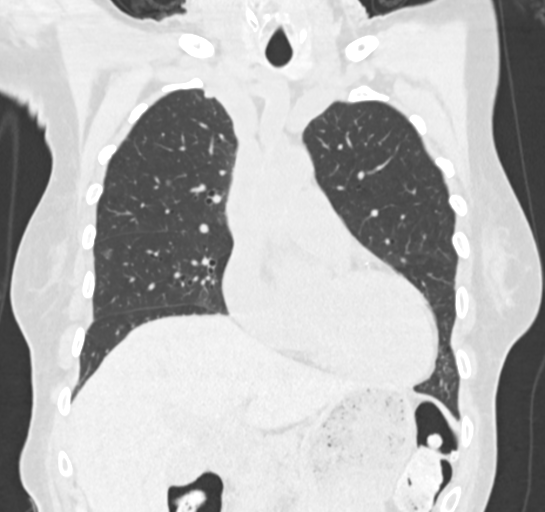
[im 81/135  lung]
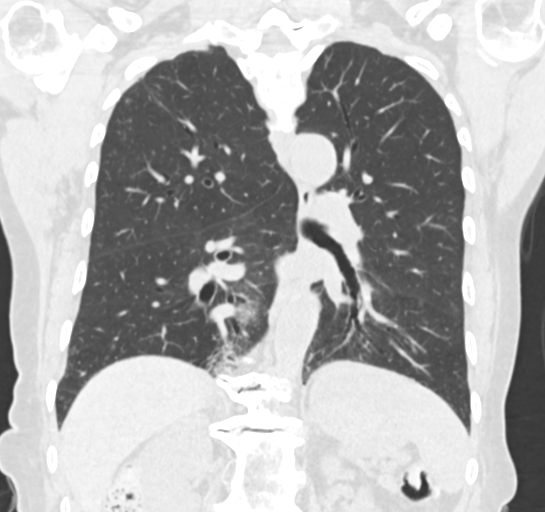

[Series 10: chest 1.00 br40 s3 super d · axial · 0.64mm/px · z∈[+1589,+1852]mm · 12 of 381 slices shown, 15 images]
[im 26/381  mediastinal]
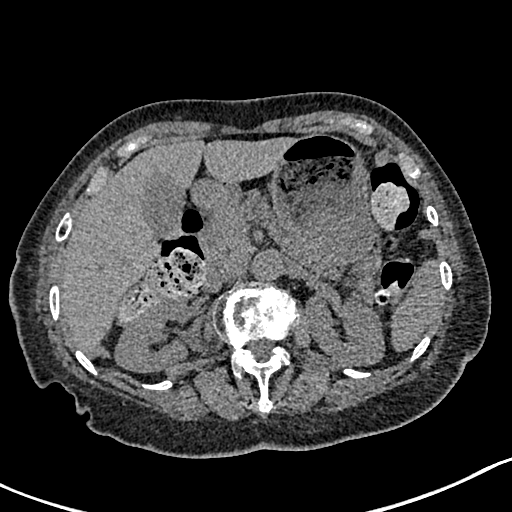
[im 26/381  lung]
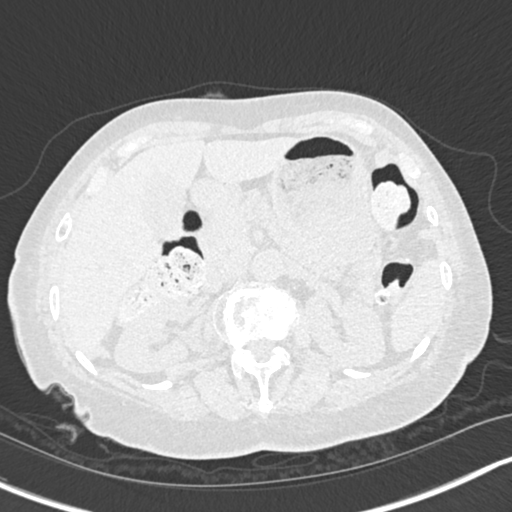
[im 51/381  lung]
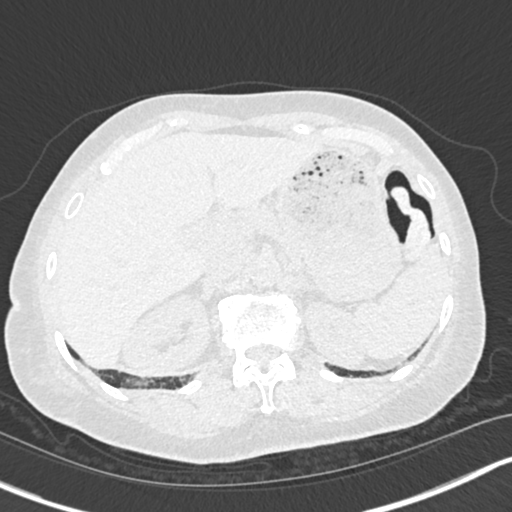
[im 77/381  lung]
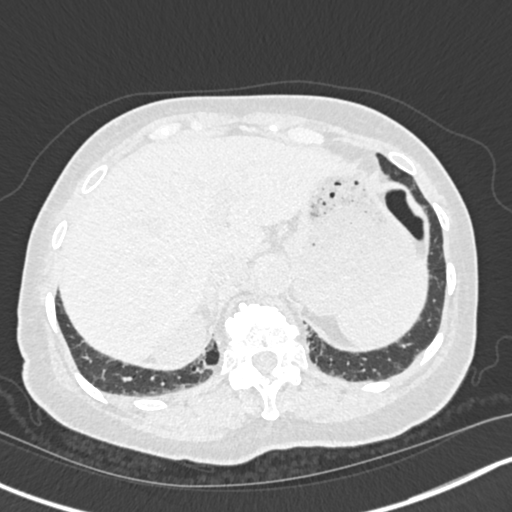
[im 127/381  lung]
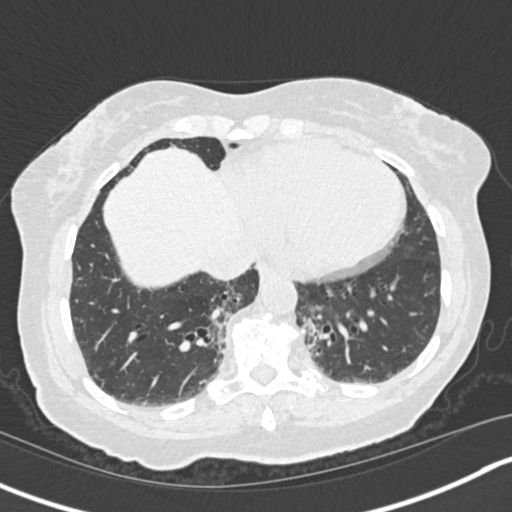
[im 153/381  mediastinal]
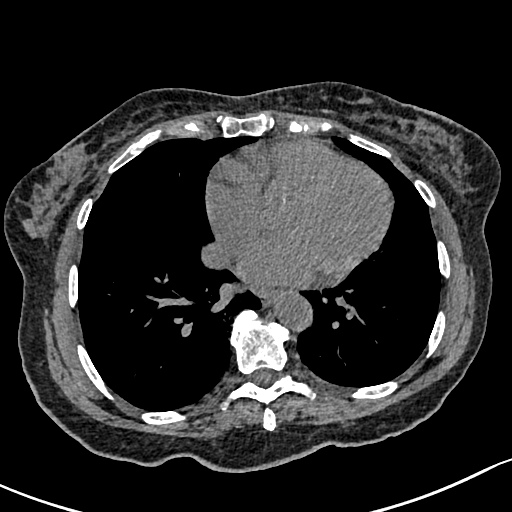
[im 153/381  lung]
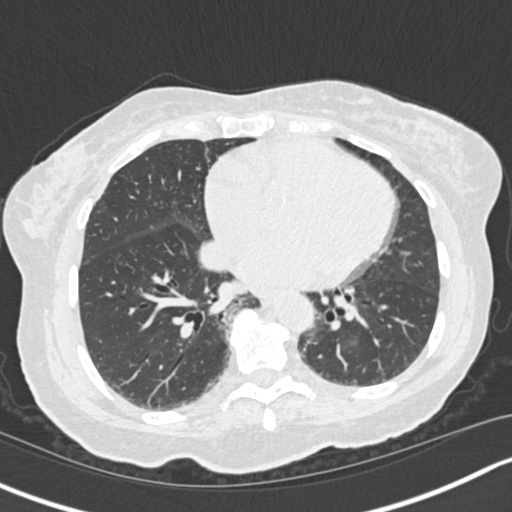
[im 178/381  lung]
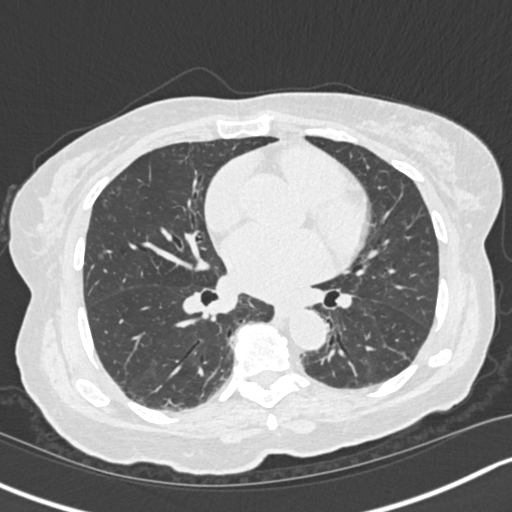
[im 203/381  lung]
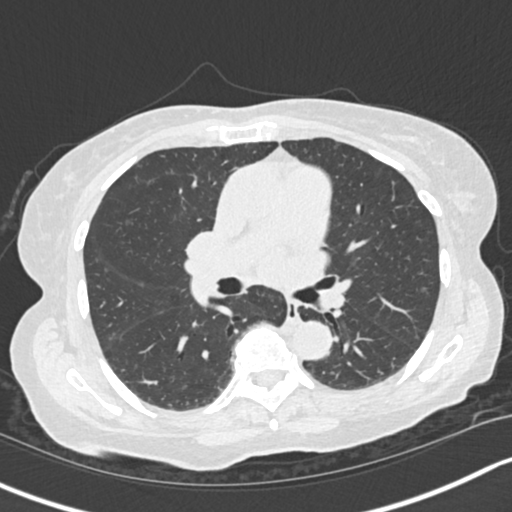
[im 229/381  lung]
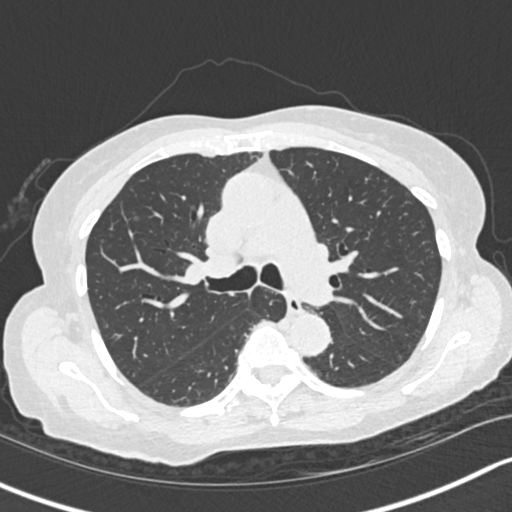
[im 254/381  mediastinal]
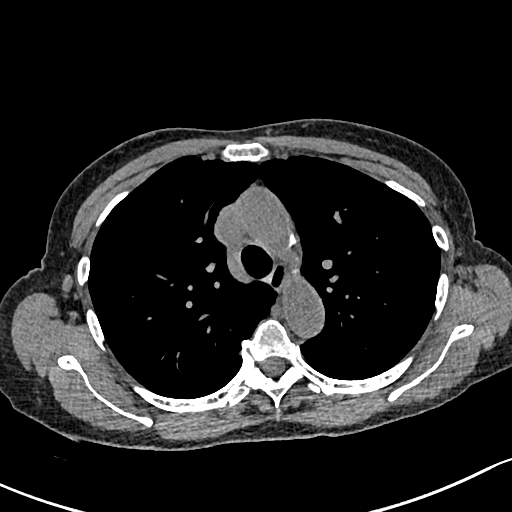
[im 254/381  lung]
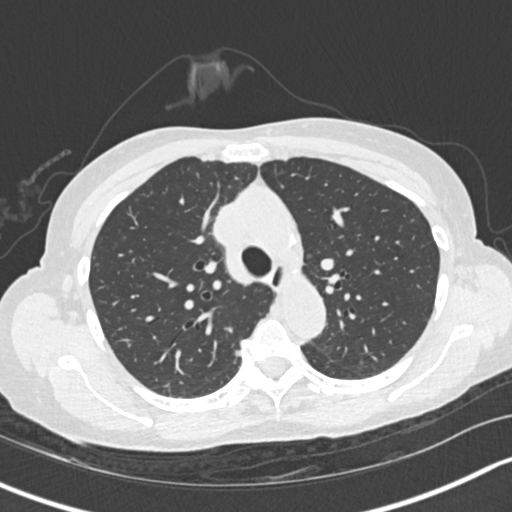
[im 305/381  lung]
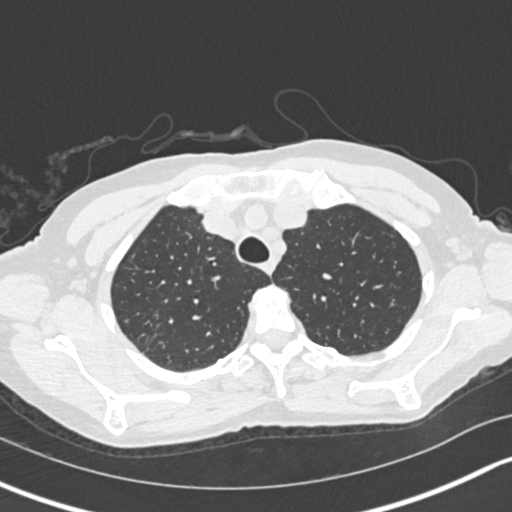
[im 330/381  lung]
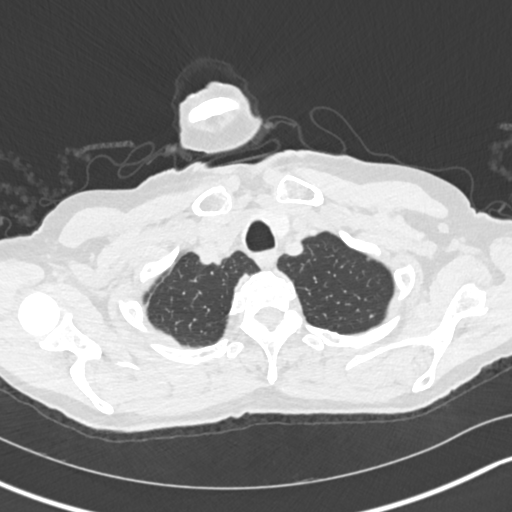
[im 355/381  lung]
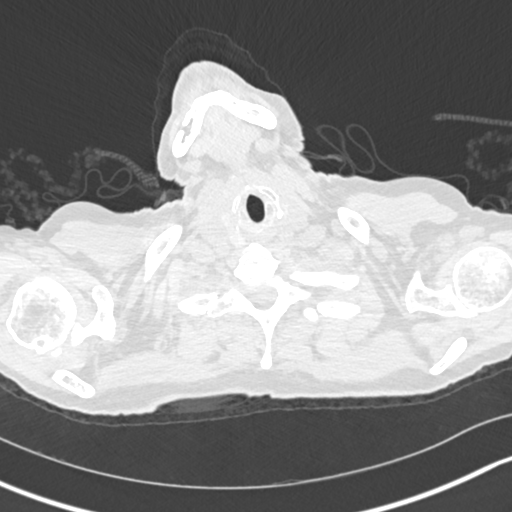

[15 of 36 positions shown; findings below may reference images not displayed]

FINDINGS: Cardiovascular: Somewhat limited due to lack of IV contrast.
Scattered atherosclerotic calcifications are noted. No aneurysmal
dilatation is seen. No cardiac enlargement is seen. Mild coronary
calcifications are noted.

Mediastinum/Nodes: Previously seen subcentimeter thyroid nodules are
not well appreciated due to lack of IV contrast. No sizable hilar or
mediastinal adenopathy is noted. The esophagus as visualized is
within normal limits.

Lungs/Pleura: Lungs are well aerated bilaterally. Areas of tubular
bronchiectasis are noted in the lower lobes bilaterally. No focal
confluent infiltrate or sizable effusion is seen. Scattered small
less than 5 mm ground-glass nodules are again identified stable in
appearance when compared with the prior exams. No new focal
parenchymal nodules are seen. Few scattered calcified granulomas are
noted.

Upper Abdomen: Visualized upper abdomen is unremarkable.

Musculoskeletal: Degenerative change of thoracic spine is noted. No
acute bony abnormality is seen.
IMPRESSION: Stable small ground-glass nodules measuring less than 5 mm. These
have been stable over 18 months and likely of a benign etiology
given their stability. Consider additional non-contrast chest CT
examinations in 2 and 4 years as clinically indicated. This
recommendation follows the consensus statement: Guidelines for
Management of Incidental Pulmonary Nodules Detected on CT Images:

Stable changes of bronchiectasis in the lower lobes bilaterally.

Previously seen small thyroid nodules are not well visualized due to
lack of IV contrast. No further follow-up was recommended on prior
exam.

Aortic Atherosclerosis ([id number].T).
